# Patient Record
Sex: Female | Born: 1960 | Race: White | Hispanic: No | Marital: Single | Smoking: Never smoker
Health system: Southern US, Community
[De-identification: ages and names within clinical notes are randomized; demographics above are authoritative.]

## PROBLEM LIST (undated history)

## (undated) DIAGNOSIS — T7840XA Allergy, unspecified, initial encounter: Secondary | ICD-10-CM

## (undated) HISTORY — DX: Allergy, unspecified, initial encounter: T78.40XA

---

## 2013-05-28 ENCOUNTER — Emergency Department (INDEPENDENT_AMBULATORY_CARE_PROVIDER_SITE_OTHER): Payer: BC Managed Care – PPO

## 2013-05-28 ENCOUNTER — Encounter (HOSPITAL_COMMUNITY): Payer: Self-pay | Admitting: *Deleted

## 2013-05-28 ENCOUNTER — Emergency Department (HOSPITAL_COMMUNITY)
Admission: EM | Admit: 2013-05-28 | Discharge: 2013-05-28 | Disposition: A | Discharge status: Home / Self Care | Payer: BC Managed Care – PPO | Source: Home / Self Care | Attending: Emergency Medicine | Admitting: Emergency Medicine

## 2013-05-28 DIAGNOSIS — N200 Calculus of kidney: Secondary | ICD-10-CM

## 2013-05-28 LAB — POCT URINALYSIS DIP (DEVICE)
Glucose, UA: NEGATIVE mg/dL
Hgb urine dipstick: NEGATIVE
Leukocytes, UA: NEGATIVE
Nitrite: NEGATIVE
Protein, ur: NEGATIVE mg/dL
Specific Gravity, Urine: <=1.005 (ref 1.005–1.030)
Urobilinogen, UA: 0.2 mg/dL (ref 0.0–1.0)
pH: 5.5 (ref 5.0–8.0)

## 2013-05-28 MED ORDER — MELOXICAM 15 MG PO TABS: 15.0000 mg | ORAL_TABLET | Freq: Every day | ORAL | Status: DC

## 2013-05-28 MED ORDER — KETOROLAC TROMETHAMINE 60 MG/2ML IM SOLN
60.0000 mg | Freq: Once | INTRAMUSCULAR | Status: AC
  Administered 2013-05-28: 60 mg via INTRAMUSCULAR

## 2013-05-28 MED ORDER — KETOROLAC TROMETHAMINE 60 MG/2ML IM SOLN
INTRAMUSCULAR | Status: AC
  Filled 2013-05-28: qty 2

## 2013-05-28 MED ORDER — OXYCODONE-ACETAMINOPHEN 5-325 MG PO TABS: ORAL_TABLET | ORAL | Status: DC

## 2013-05-28 MED ORDER — TAMSULOSIN HCL 0.4 MG PO CAPS: 0.4000 mg | ORAL_CAPSULE | Freq: Every day | ORAL | Status: DC

## 2013-05-28 NOTE — ED Notes (Signed)
Pt  Reports a  History  Of  Kidney  Stones    She  Reports  Diffuse  low  Back  Pain  With  Nausea  That  Developed  Last  Pm    She  denys  Any  Recent  specefic  Injury

## 2013-05-28 NOTE — ED Provider Notes (Signed)
Chief Complaint:   Chief Complaint  Patient presents with  . Back Pain    History of Present Illness:   Sylvia White is a 52 year old female who was awakened at 4 AM this morning with bilateral, crampy lower back pain rated 6/10 in intensity. There was no radiation the pain. Nothing made it better or worse. She denies any numbness, tingling, weakness, or bladder or bowel dysfunction. She has a history of having passed about 3 kidney stones since 1983. Her last kidney stone was in 2004. She's had no dysuria, frequency, or blood in the urine. She denies fever, chills, abdominal pain, nausea, or vomiting.  Review of Systems:  Other than noted above, the patient denies any of the following symptoms: Systemic:  No fever, chills, severe fatigue, or unexplained weight loss. GI:  No abdominal pain, nausea, vomiting, diarrhea, constipation, incontinence of bowel, or blood in stool. GU:  No dysuria, frequency, urgency, or hematuria. No incontinence of urine or difficulty urinating.  M-S:  No neck pain, joint pain, arthritis, or myalgias. Neuro:  No paresthesias, saddle anesthesia, muscular weakness, or progressive neurological deficit.  PMFSH:  Past medical history, family history, social history, meds, and allergies were reviewed. Specifically, there is no history of cancer, major trauma, osteoporosis, immunosuppression, or HIV infection. She takes Synthroid for hypothyroidism.  Physical Exam:   Vital signs:  BP 112/78  Pulse 78  Temp(Src) 98.6 F (37 C) (Oral)  Resp 16  SpO2 100% General:  Alert, oriented, in no distress. Abdomen:  Soft, non-tender.  No organomegaly or mass.  No pulsatile midline abdominal mass or bruit. Back:  No CVA tenderness. Her back has a full range of motion with mild pain. Straight leg raising is negative. Neuro:  Normal muscle strength, sensations and DTRs. Extremities: Pedal pulses were full, there was no edema. Skin:  Clear, warm and dry.  No rash.  Labs:   Results for  orders placed during the hospital encounter of 05/28/13  POCT URINALYSIS DIP (DEVICE)      Result Value Range   Glucose, UA NEGATIVE  NEGATIVE mg/dL   Bilirubin Urine NEGATIVE  NEGATIVE   Ketones, ur NEGATIVE  NEGATIVE mg/dL   Specific Gravity, Urine <=1.005  1.005 - 1.030   Hgb urine dipstick NEGATIVE  NEGATIVE   pH 5.5  5.0 - 8.0   Protein, ur NEGATIVE  NEGATIVE mg/dL   Urobilinogen, UA 0.2  0.0 - 1.0 mg/dL   Nitrite NEGATIVE  NEGATIVE   Leukocytes, UA NEGATIVE  NEGATIVE     Radiology:  Dg Abd 1 View  05/28/2013   CLINICAL DATA:  52 year old female with pain on the right side. Back pain. History of right kidney stone. Initial encounter.  EXAM: ABDOMEN - 1 VIEW  COMPARISON:  None.  FINDINGS: Portable AP supine view at 0900 hrs. 6-7 mm calculus projecting at the right renal lower pole. Small calculi in the right hemipelvis most resemble phleboliths. No definite additional urologic calculus. Visualized bowel gas pattern is non obstructed. No acute osseous abnormality identified.  IMPRESSION: 6-7 mm lower pole region right nephrolithiasis. No other urologic calculus identified. Non obstructed bowel gas pattern.   Electronically Signed   By: Augusto Gamble M.D.   On: 05/28/2013 09:16    Course in Urgent Care Center:   Given Toradol 60 mg IM.  Assessment:  The encounter diagnosis was Kidney stone.  Plan:   1.  Meds:  The following meds were prescribed:   New Prescriptions   MELOXICAM (MOBIC) 15  MG TABLET    Take 1 tablet (15 mg total) by mouth daily.   OXYCODONE-ACETAMINOPHEN (PERCOCET) 5-325 MG PER TABLET    1 to 2 tablets every 6 hours as needed for pain.   TAMSULOSIN (FLOMAX) 0.4 MG CAPS CAPSULE    Take 1 capsule (0.4 mg total) by mouth daily.    2.  Patient Education/Counseling:  The patient was given appropriate handouts, self care instructions, and instructed in symptomatic relief. She was instructed to strain her urine and try to catch the kidney stone, increase liquids, and discussed  dietary treatment of kidney stone disease.  3.  Follow up:  The patient was told to follow up if no better in 3 to 4 days, if becoming worse in any way, and given some red flag symptoms such as fever, worsening pain, or persistent vomiting which would prompt immediate return.  Follow up with Dr. Marlou Porch if no better by next week.     Reuben Likes, MD 05/28/13 [id number]

## 2014-08-02 ENCOUNTER — Other Ambulatory Visit: Payer: Self-pay | Admitting: Family Medicine

## 2014-08-02 ENCOUNTER — Other Ambulatory Visit (HOSPITAL_COMMUNITY)
Admission: RE | Admit: 2014-08-02 | Discharge: 2014-08-02 | Disposition: A | Discharge status: Home / Self Care | Payer: BC Managed Care – PPO | Source: Ambulatory Visit | Attending: Family Medicine | Admitting: Family Medicine

## 2014-08-02 DIAGNOSIS — Z124 Encounter for screening for malignant neoplasm of cervix: Secondary | ICD-10-CM | POA: Insufficient documentation

## 2014-08-02 DIAGNOSIS — Z1151 Encounter for screening for human papillomavirus (HPV): Secondary | ICD-10-CM | POA: Diagnosis present

## 2014-08-04 LAB — CYTOLOGY - PAP

## 2015-09-02 ENCOUNTER — Encounter: Payer: Self-pay | Admitting: Endocrinology

## 2015-09-02 ENCOUNTER — Ambulatory Visit (INDEPENDENT_AMBULATORY_CARE_PROVIDER_SITE_OTHER): Payer: BC Managed Care – PPO | Admitting: Endocrinology

## 2015-09-02 VITALS — BP 104/62 | HR 57 | Temp 98.3°F | Resp 14 | Ht 64.0 in | Wt 133.8 lb

## 2015-09-02 DIAGNOSIS — E038 Other specified hypothyroidism: Secondary | ICD-10-CM

## 2015-09-02 DIAGNOSIS — E063 Autoimmune thyroiditis: Secondary | ICD-10-CM | POA: Insufficient documentation

## 2015-09-02 MED ORDER — THYROID 60 MG PO TABS: 60.0000 mg | ORAL_TABLET | Freq: Every day | ORAL | Status: DC

## 2015-09-02 NOTE — Progress Notes (Signed)
Patient ID: Sylvia BackboneLisa White, female   DOB: 05-23-1961, 55 y.o.   MRN: [id number]            Reason for Appointment:  Hypothyroidism, new visit    History of Present Illness:   Hypothyroidism was first diagnosed in 1990s  At the time of diagnosis patient was having symptoms of significant  fatigue but no other typical symptoms that she remembers.  She was tested for thyroid because of her sister also having the same diagnosis Initially was given 50 g of levothyroxine and subsequently 75 With starting thyroid supplementation the patient's symptoms had improved  She has been on 75 g levothyroxine for several years now.  About 6 years [facility] but lately by PCP She thinks that for the last 2-3 years she has been feeling more tired She wakes up feeling tired in the morning and has no energy left in the evenings after work. She also things that she has more irritability, difficulty with clear thinking, some hair loss and gradual weight gain She does feel cold sensitivity but this is not any worse.  Has cold hands. No dry skin or constipation She thinks she has difficulty losing weight despite eating a healthy diet and exercising regularly including running  She is taking brand name Synthroid for some time.  She is consistent with taking that about an hour before breakfast        Patient's weight history is as follows:  Wt Readings from Last 3 Encounters:  09/02/15 133 lb 12.8 oz (60.691 kg)    Thyroid function results have been as follows:  TSH in 11/16 was 1.46 and in 04/2014 was 1.68  No results found for: FREET4, TSH   Past Medical History  Diagnosis Date  . Allergy     No past surgical history on file.  Family History  Problem Relation Age of Onset  . Hypothyroidism Mother   . Hypothyroidism Father   . Diabetes Maternal Grandmother     Social History:  reports that she has never smoked. She has never used smokeless tobacco. Her  alcohol and drug histories are not on file.  Allergies: No Known Allergies    Medication List       This list is accurate as of: 09/02/15  4:24 PM.  Always use your most recent med list.               ALLEGRA ALLERGY 180 MG tablet  Generic drug:  fexofenadine     FLULAVAL QUADRIVALENT injection  Generic drug:  influenza vac split quadrivalent  ADM 0.5ML IM UTD     NASACORT ALLERGY 24HR 55 MCG/ACT Aero nasal inhaler  Generic drug:  triamcinolone     thyroid 60 MG tablet  Commonly known as:  ARMOUR THYROID  Take 1 tablet (60 mg total) by mouth daily before breakfast.     Vitamin D 400 UNIT/ML Liqd        Review of Systems:  Review of Systems  Constitutional: Positive for weight gain.  Respiratory: Negative for shortness of breath.   Cardiovascular: Negative for palpitations.  Endocrine: Positive for fatigue and cold intolerance.       She had premature menopause at age 55.  Periodically does feel hot  Genitourinary: Negative for nocturia.  Musculoskeletal: Negative for joint pain.  Neurological:       Foggy  Psychiatric/Behavioral: Positive for insomnia.       She wakes up because she feels heart  but goes back to sleep. Not anxious but has increased irritability Does not think she has depressed mood but overall feels blah                 Examination:    BP 104/62 mmHg  Pulse 57  Temp(Src) 98.3 F (36.8 C)  Resp 14  Ht 5\' 4"  (1.626 m)  Wt 133 lb 12.8 oz (60.691 kg)  BMI 22.96 kg/m2  SpO2 97%  GENERAL:  Average build.   No pallor, clubbing, lymphadenopathy or edema.  Skin:  no rash or pigmentation.  EYES:  No prominence of the eyes or swelling of the eyelids  ENT: Oral mucosa and tongue normal.  THYROID:  Not palpable.  HEART:  Normal  S1 and S2; no murmur or click.  CHEST:    Lungs: Vescicular breath sounds heard equally.  No crepitations/ wheeze.  ABDOMEN: Exam not indicated  NEUROLOGICAL: Reflexes are difficult to elicit bilaterally at  biceps and ankles.  JOINTS:  Normal.   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   She probably also has inadequate sleep quality with waking up tired  Since she does not believe that she has depression discussed that some patients may subjectively feels better with the combination of levothyroxine and T3  PLAN:  Recommended that she can do an empirical trial of Armour Thyroid 60 mg which would be equivalent to 75 g of Synthroid This would be simpler to prescribe and adjust than Synthroid and Cytomel separately and can be given once a day If she is subjectively improved in 6 weeks she can continue the new regimen, will need to have follow-up thyroid panel done also to assess her response to this Also discussed possibility of using a component preparation if she is having good response but has abnormal free T4 or free T3 levels in follow-up   [facility] 09/02/2015, 4:24 PM     Note: This office note was prepared with Dragon voice recognition system technology. Any transcriptional errors that result from this process are unintentional.

## 2015-10-04 ENCOUNTER — Other Ambulatory Visit: Payer: BC Managed Care – PPO

## 2015-10-07 ENCOUNTER — Ambulatory Visit: Payer: BC Managed Care – PPO | Admitting: Endocrinology

## 2020-08-03 ENCOUNTER — Ambulatory Visit: Payer: Self-pay

## 2020-08-03 ENCOUNTER — Ambulatory Visit: Payer: BC Managed Care – PPO | Admitting: Family Medicine

## 2020-08-03 ENCOUNTER — Encounter: Payer: Self-pay | Admitting: Family Medicine

## 2020-08-03 ENCOUNTER — Other Ambulatory Visit: Payer: Self-pay

## 2020-08-03 VITALS — BP 118/78 | Ht 64.0 in | Wt 130.0 lb

## 2020-08-03 DIAGNOSIS — M79662 Pain in left lower leg: Secondary | ICD-10-CM | POA: Diagnosis not present

## 2020-08-03 DIAGNOSIS — M7711 Lateral epicondylitis, right elbow: Secondary | ICD-10-CM

## 2020-08-03 NOTE — Patient Instructions (Signed)
You have a distal tibia stress fracture. Wear the aircast brace when up and walking around and follow the protocol on a weekly basis. Icing 15 minutes at a time 3-4 times a day. Follow up with me in 2 weeks for reevaluation, repeat ultrasound.  You have lateral epicondylitis Try to avoid painful activities as much as possible. Ice the area 3-4 times a day for 15 minutes at a time. Voltaren gel topically as needed. Counterforce brace (or KT tape) as directed can help unload area - wear this regularly if it provides you with relief. Hammer rotation exercise, wrist extension exercise with 1 pound weight - 3 sets of 10 once a day.   Stretching - hold for 20-30 seconds and repeat 3 times. Consider physical therapy, injection, nitro patches if not improving. Follow up in 6 weeks for this issue.

## 2020-08-03 NOTE — Progress Notes (Signed)
PCP: Clayborn Heron, MD  Subjective:   HPI: Patient is a 59 y.o. female here for left shin pain and right elbow pain.  Regarding the left shin pain the patient reports she is a runner and runs on average 15 miles a week.  She was running on Thanksgiving day and noticed pain at her distal third of her shin with every step.  The pain progressively worsened and eventually got to the point where it was hurting for her to walk.  She has not experienced pain like this before and denies any trauma to the shin.  She has had shin splints in the past but says that it feels different.  Has not run since 12/4.  Has used topical medications with little relief.  Her right elbow pain occurs intermittently.  She reports it started when she got a rowing machine in September.  Notices point tenderness on the lateral aspect of the elbow which is aggravated by lifting objects.  She is not complaining of pain at this time.  Is concerned because she does not want to make things worse if she continues to use her rowing machine.  Past Medical History:  Diagnosis Date  . Allergy     Current Outpatient Medications on File Prior to Visit  Medication Sig Dispense Refill  . Cholecalciferol (VITAMIN D) 400 UNIT/ML LIQD     . fexofenadine (ALLEGRA ALLERGY) 180 MG tablet     . FLULAVAL QUADRIVALENT injection ADM 0.5ML IM UTD  0  . thyroid (ARMOUR THYROID) 60 MG tablet Take 1 tablet (60 mg total) by mouth daily before breakfast. 30 tablet 3  . triamcinolone (NASACORT ALLERGY 24HR) 55 MCG/ACT AERO nasal inhaler      No current facility-administered medications on file prior to visit.    No past surgical history on file.  No Known Allergies  Social History   Socioeconomic History  . Marital status: Single    Spouse name: Not on file  . Number of children: Not on file  . Years of education: Not on file  . Highest education level: Not on file  Occupational History  . Not on file  Tobacco Use  . Smoking  status: Never Smoker  . Smokeless tobacco: Never Used  Substance and Sexual Activity  . Alcohol use: Not on file  . Drug use: Not on file  . Sexual activity: Not on file  Other Topics Concern  . Not on file  Social History Narrative  . Not on file   Social Determinants of Health   Financial Resource Strain:   . Difficulty of Paying Living Expenses: Not on file  Food Insecurity:   . Worried About Programme researcher, broadcasting/film/video in the Last Year: Not on file  . Ran Out of Food in the Last Year: Not on file  Transportation Needs:   . Lack of Transportation (Medical): Not on file  . Lack of Transportation (Non-Medical): Not on file  Physical Activity:   . Days of Exercise per Week: Not on file  . Minutes of Exercise per Session: Not on file  Stress:   . Feeling of Stress : Not on file  Social Connections:   . Frequency of Communication with Friends and Family: Not on file  . Frequency of Social Gatherings with Friends and Family: Not on file  . Attends Religious Services: Not on file  . Active Member of Clubs or Organizations: Not on file  . Attends Banker Meetings: Not on file  .  Marital Status: Not on file  Intimate Partner Violence:   . Fear of Current or Ex-Partner: Not on file  . Emotionally Abused: Not on file  . Physically Abused: Not on file  . Sexually Abused: Not on file    Family History  Problem Relation Age of Onset  . Hypothyroidism Mother   . Hypothyroidism Father   . Diabetes Maternal Grandmother     BP 118/78   Ht 5\' 4"  (1.626 m)   Wt 130 lb (59 kg)   BMI 22.31 kg/m   [facility] Exercise 08/03/2020  Frequency of aerobic exercise (# of days/week) 5  Average time in minutes 45  Frequency of strengthening activities (# of days/week) 2    No flowsheet data found.  Review of Systems: See HPI above.     Objective:  Physical Exam:  Gen: NAD, comfortable in exam room  Left lower extremity exam: No obvious deformities noted,  no erythema, edema noted full range of motion in knee and ankle without pain.  Tenderness to palpation along medial aspect of distal third of her left tibia.  No swelling noted.    Right Elbow: Inspection yields no evidence of bony deformity, effusion, erythema, ecchymosis, or rash. Active and passive ROM intact in flexion/extension/supination/pronation. Strength 5/5 throughout.  Mild tenderness to lateral epicondyle.  No pain with finger/wrist extension against resistance. No pain with gripping or finger/wrist flexion against resistance. No evidence of pain or laxity at the UCL.   Limited ultrasound left lower extremity:  Small cortical irregularity over distal tibia, edema overlying cortex, and neovascularity noted consistent with stress fracture.   Assessment & Plan:  1.  Stress fracture of left tibia Patient with history and physical exam consistent with stress fracture.  Ultrasound consistent with stress fracture distal third of left tibia.  Initiated stress fracture protocol with the patient.  Provided information regarding the protocol.  Long aircast.  Icing if needed.  Follow-up in 2 weeks for reevaluation and repeat ultrasound.  2.  Lateral epicondylitis Patient with pain of the lateral epicondyle since using rowing machine.  Physical exam consistent with lateral epicondylitis.  Ultrasound was reassuring.  Recommended rehab exercises for lateral epicondylitis.  Follow-up as needed.

## 2020-08-15 ENCOUNTER — Ambulatory Visit: Payer: Self-pay

## 2020-08-15 ENCOUNTER — Other Ambulatory Visit: Payer: Self-pay

## 2020-08-15 ENCOUNTER — Ambulatory Visit: Payer: BC Managed Care – PPO | Admitting: Family Medicine

## 2020-08-15 VITALS — BP 106/72 | Ht 64.0 in | Wt 130.0 lb

## 2020-08-15 DIAGNOSIS — M79662 Pain in left lower leg: Secondary | ICD-10-CM

## 2020-08-15 NOTE — Patient Instructions (Signed)
Wait 1 more week before starting phase 2 of the protocol. Wait a week to start rowing also. Icing 15 minutes at a time as needed. Follow up with me in about 4 weeks but call or message me sooner if you have questions or concerns.

## 2020-08-16 ENCOUNTER — Encounter: Payer: Self-pay | Admitting: Family Medicine

## 2020-08-16 NOTE — Progress Notes (Signed)
PCP: Clayborn Heron, MD  Subjective:   HPI: Patient is a 59 y.o. female here for left lower leg pain.  12/8: Regarding the left shin pain the patient reports she is a runner and runs on average 15 miles a week.  She was running on Thanksgiving day and noticed pain at her distal third of her shin with every step.  The pain progressively worsened and eventually got to the point where it was hurting for her to walk.  She has not experienced pain like this before and denies any trauma to the shin.  She has had shin splints in the past but says that it feels different.  Has not run since 12/4.  Has used topical medications with little relief.  Her right elbow pain occurs intermittently.  She reports it started when she got a rowing machine in September.  Notices point tenderness on the lateral aspect of the elbow which is aggravated by lifting objects.  She is not complaining of pain at this time.  Is concerned because she does not want to make things worse if she continues to use her rowing machine.  12/20: Patient reports she had been doing well until she rowed for 10 minutes. Felt great while doing this but developed an achy, throbbing pain anterior left shin. No swelling, bruising after this. Has been wearing aircast and following protocol. No acute injuries.  Past Medical History:  Diagnosis Date  . Allergy     Current Outpatient Medications on File Prior to Visit  Medication Sig Dispense Refill  . Cholecalciferol (VITAMIN D) 400 UNIT/ML LIQD     . fexofenadine (ALLEGRA ALLERGY) 180 MG tablet     . FLULAVAL QUADRIVALENT injection ADM 0.5ML IM UTD  0  . liothyronine (CYTOMEL) 5 MCG tablet Take 5 mcg by mouth daily.    Marland Kitchen SYNTHROID 75 MCG tablet Take 75 mcg by mouth daily.    Marland Kitchen triamcinolone (NASACORT ALLERGY 24HR) 55 MCG/ACT AERO nasal inhaler      No current facility-administered medications on file prior to visit.    History reviewed. No pertinent surgical history.  No  Known Allergies  Social History   Socioeconomic History  . Marital status: Single    Spouse name: Not on file  . Number of children: Not on file  . Years of education: Not on file  . Highest education level: Not on file  Occupational History  . Not on file  Tobacco Use  . Smoking status: Never Smoker  . Smokeless tobacco: Never Used  Substance and Sexual Activity  . Alcohol use: Not on file  . Drug use: Not on file  . Sexual activity: Not on file  Other Topics Concern  . Not on file  Social History Narrative  . Not on file   Social Determinants of Health   Financial Resource Strain: Not on file  Food Insecurity: Not on file  Transportation Needs: Not on file  Physical Activity: Not on file  Stress: Not on file  Social Connections: Not on file  Intimate Partner Violence: Not on file    Family History  Problem Relation Age of Onset  . Hypothyroidism Mother   . Hypothyroidism Father   . Diabetes Maternal Grandmother     BP 106/72   Ht 5\' 4"  (1.626 m)   Wt 130 lb (59 kg)   BMI 22.31 kg/m   [facility] Adult Exercise 08/03/2020  Frequency of aerobic exercise (# of days/week) 5  Average time in  minutes 45  Frequency of strengthening activities (# of days/week) 2    No flowsheet data found.  Review of Systems: See HPI above.     Objective:  Physical Exam:  Gen: NAD, comfortable in exam room  Left leg: No deformity, swelling, bruising. FROM with 5/5 strength ankle without reproduction of pain. Tenderness to palpation distal anterior tibia. NVI distally.  Limited MSK u/s left lower leg:  Small cortical irregularity visualized with neovascularity and edema overlying cortex.   Assessment & Plan:  1. Left tibia stress fracture - low risk site.  Flare of pain after rowing.  Will wait an additional week before starting phase 2 of protocol and rowing.  Icing as needed.  F/u in 4 weeks.

## 2020-09-19 ENCOUNTER — Ambulatory Visit: Payer: BC Managed Care – PPO | Admitting: Family Medicine

## 2020-09-19 ENCOUNTER — Ambulatory Visit: Payer: Self-pay

## 2020-09-19 ENCOUNTER — Encounter: Payer: Self-pay | Admitting: Family Medicine

## 2020-09-19 ENCOUNTER — Other Ambulatory Visit: Payer: Self-pay

## 2020-09-19 VITALS — BP 120/68 | Ht 64.0 in | Wt 130.0 lb

## 2020-09-19 DIAGNOSIS — M7989 Other specified soft tissue disorders: Secondary | ICD-10-CM | POA: Diagnosis not present

## 2020-09-19 DIAGNOSIS — M79662 Pain in left lower leg: Secondary | ICD-10-CM

## 2020-09-19 NOTE — Progress Notes (Signed)
PCP: Clayborn Heron, MD  Subjective:   HPI: Patient is a 60 y.o. female here for left lower leg pain.  12/8: Regarding the left shin pain the patient reports she is a runner and runs on average 15 miles a week.  She was running on Thanksgiving day and noticed pain at her distal third of her shin with every step.  The pain progressively worsened and eventually got to the point where it was hurting for her to walk.  She has not experienced pain like this before and denies any trauma to the shin.  She has had shin splints in the past but says that it feels different.  Has not run since 12/4.  Has used topical medications with little relief.  Her right elbow pain occurs intermittently.  She reports it started when she got a rowing machine in September.  Notices point tenderness on the lateral aspect of the elbow which is aggravated by lifting objects.  She is not complaining of pain at this time.  Is concerned because she does not want to make things worse if she continues to use her rowing machine.  12/20: Patient reports she had been doing well until she rowed for 10 minutes. Felt great while doing this but developed an achy, throbbing pain anterior left shin. No swelling, bruising after this. Has been wearing aircast and following protocol. No acute injuries.  09/19/20: Patient reports she's doing well. Has not really tested leg since last visit. Has been rowing and walking without any issues. She tried running over a month ago and had some aching afterwards so decided not to run until reevaluation.  Past Medical History:  Diagnosis Date  . Allergy     Current Outpatient Medications on File Prior to Visit  Medication Sig Dispense Refill  . Cholecalciferol (VITAMIN D) 400 UNIT/ML LIQD     . fexofenadine (ALLEGRA ALLERGY) 180 MG tablet     . FLULAVAL QUADRIVALENT injection ADM 0.5ML IM UTD  0  . liothyronine (CYTOMEL) 5 MCG tablet Take 5 mcg by mouth daily.    Marland Kitchen SYNTHROID 75 MCG  tablet Take 75 mcg by mouth daily.    Marland Kitchen triamcinolone (NASACORT ALLERGY 24HR) 55 MCG/ACT AERO nasal inhaler      No current facility-administered medications on file prior to visit.    History reviewed. No pertinent surgical history.  No Known Allergies  Social History   Socioeconomic History  . Marital status: Single    Spouse name: Not on file  . Number of children: Not on file  . Years of education: Not on file  . Highest education level: Not on file  Occupational History  . Not on file  Tobacco Use  . Smoking status: Never Smoker  . Smokeless tobacco: Never Used  Substance and Sexual Activity  . Alcohol use: Not on file  . Drug use: Not on file  . Sexual activity: Not on file  Other Topics Concern  . Not on file  Social History Narrative  . Not on file   Social Determinants of Health   Financial Resource Strain: Not on file  Food Insecurity: Not on file  Transportation Needs: Not on file  Physical Activity: Not on file  Stress: Not on file  Social Connections: Not on file  Intimate Partner Violence: Not on file    Family History  Problem Relation Age of Onset  . Hypothyroidism Mother   . Hypothyroidism Father   . Diabetes Maternal Grandmother  BP 120/68   Ht 5\' 4"  (1.626 m)   Wt 130 lb (59 kg)   BMI 22.31 kg/m   [facility] Exercise 08/03/2020  Frequency of aerobic exercise (# of days/week) 5  Average time in minutes 45  Frequency of strengthening activities (# of days/week) 2    No flowsheet data found.  Review of Systems: See HPI above.     Objective:  Physical Exam:  Gen: NAD, comfortable in exam room  Left leg: No deformity. FROM with 5/5 strength. No tenderness to palpation. NVI distally. Negative hop test.  Limited MSK u/s left lower leg:  Small callus without neovascularity in area of stress fracture.   Assessment & Plan:  1. Left tibia stress fracture - excellent healing since last visit clinically and by  ultrasound.  Restart protocol.  Icing if needed.  F/u in 6 weeks.

## 2020-09-19 NOTE — Patient Instructions (Signed)
You're doing great! Restart the protocol at the 400/482m jog level with the aircast and advance from there. I don't expect you to have any issues but call me if you do. Follow up with me in 6 weeks.

## 2020-10-19 ENCOUNTER — Ambulatory Visit
Admission: RE | Admit: 2020-10-19 | Discharge: 2020-10-19 | Disposition: A | Discharge status: Home / Self Care | Payer: BC Managed Care – PPO | Source: Ambulatory Visit | Attending: Family Medicine | Admitting: Family Medicine

## 2020-10-19 ENCOUNTER — Ambulatory Visit: Payer: Self-pay

## 2020-10-19 ENCOUNTER — Ambulatory Visit: Payer: BC Managed Care – PPO | Admitting: Family Medicine

## 2020-10-19 ENCOUNTER — Other Ambulatory Visit: Payer: Self-pay

## 2020-10-19 VITALS — BP 102/70 | Ht 64.0 in | Wt 129.0 lb

## 2020-10-19 DIAGNOSIS — M79662 Pain in left lower leg: Secondary | ICD-10-CM

## 2020-10-19 NOTE — Patient Instructions (Signed)
Stop running for now. Ok for walking, rowing, cycling. I'll call you with the results of the MRI and next steps.

## 2020-10-20 ENCOUNTER — Encounter: Payer: Self-pay | Admitting: Family Medicine

## 2020-10-20 NOTE — Progress Notes (Signed)
PCP: Clayborn Heron, MD  Subjective:   HPI: Patient is a 60 y.o. female here for left lower leg pain.  12/8: Regarding the left shin pain the patient reports she is a runner and runs on average 15 miles a week.  She was running on Thanksgiving day and noticed pain at her distal third of her shin with every step.  The pain progressively worsened and eventually got to the point where it was hurting for her to walk.  She has not experienced pain like this before and denies any trauma to the shin.  She has had shin splints in the past but says that it feels different.  Has not run since 12/4.  Has used topical medications with little relief.  Her right elbow pain occurs intermittently.  She reports it started when she got a rowing machine in September.  Notices point tenderness on the lateral aspect of the elbow which is aggravated by lifting objects.  She is not complaining of pain at this time.  Is concerned because she does not want to make things worse if she continues to use her rowing machine.  12/20: Patient reports she had been doing well until she rowed for 10 minutes. Felt great while doing this but developed an achy, throbbing pain anterior left shin. No swelling, bruising after this. Has been wearing aircast and following protocol. No acute injuries.  09/19/20: Patient reports she's doing well. Has not really tested leg since last visit. Has been rowing and walking without any issues. She tried running over a month ago and had some aching afterwards so decided not to run until reevaluation.  2/23: Patient reports she had been doing well up until last week when running 2 mile repeats every other day. For most part had pain in area of fracture site only after running. But when tried a 2.5 mile run did feel some pain here during the run. Has been exercising with the long aircast in place. No new injuries.  Past Medical History:  Diagnosis Date  . Allergy     Current  Outpatient Medications on File Prior to Visit  Medication Sig Dispense Refill  . Cholecalciferol (VITAMIN D) 400 UNIT/ML LIQD     . fexofenadine (ALLEGRA ALLERGY) 180 MG tablet     . FLULAVAL QUADRIVALENT injection ADM 0.5ML IM UTD  0  . liothyronine (CYTOMEL) 5 MCG tablet Take 5 mcg by mouth daily.    Marland Kitchen SYNTHROID 75 MCG tablet Take 75 mcg by mouth daily.    Marland Kitchen triamcinolone (NASACORT ALLERGY 24HR) 55 MCG/ACT AERO nasal inhaler      No current facility-administered medications on file prior to visit.    History reviewed. No pertinent surgical history.  No Known Allergies  Social History   Socioeconomic History  . Marital status: Single    Spouse name: Not on file  . Number of children: Not on file  . Years of education: Not on file  . Highest education level: Not on file  Occupational History  . Not on file  Tobacco Use  . Smoking status: Never Smoker  . Smokeless tobacco: Never Used  Substance and Sexual Activity  . Alcohol use: Not on file  . Drug use: Not on file  . Sexual activity: Not on file  Other Topics Concern  . Not on file  Social History Narrative  . Not on file   Social Determinants of Health   Financial Resource Strain: Not on file  Food Insecurity: Not on  file  Transportation Needs: Not on file  Physical Activity: Not on file  Stress: Not on file  Social Connections: Not on file  Intimate Partner Violence: Not on file    Family History  Problem Relation Age of Onset  . Hypothyroidism Mother   . Hypothyroidism Father   . Diabetes Maternal Grandmother     BP 102/70   Ht 5\' 4"  (1.626 m)   Wt 129 lb (58.5 kg)   BMI 22.14 kg/m   [facility] 08/03/2020  Frequency of aerobic exercise (# of days/week) 5  Average time in minutes 45  Frequency of strengthening activities (# of days/week) 2    No flowsheet data found.  Review of Systems: See HPI above.     Objective:  Physical Exam:  Gen: NAD, comfortable in exam  room  Left leg: No deformity. FROM with 5/5 strength. Mild tenderness to palpation distal medial tibia. NVI distally. Minimal pain with hop test.  Limited MSK u/s left lower leg:  Small callus with mild neovascularity (increased from prior) in area of stress fracture.   Assessment & Plan:  1. Left tibia stress fracture - unusual that despite protocol, long aircast over 8 weeks out that she's had recurrence of pain in area of stress fracture.  Concern for possible second stress fracture vs delayed union.  Will go ahead with MRI to assess.  Stop running.  Cross training ok if not painful.  Icing, tylenol if needed.  Will call her with results.

## 2020-10-31 ENCOUNTER — Ambulatory Visit: Payer: BC Managed Care – PPO | Admitting: Family Medicine

## 2020-11-02 ENCOUNTER — Ambulatory Visit: Payer: BC Managed Care – PPO | Admitting: Family Medicine

## 2020-11-05 ENCOUNTER — Other Ambulatory Visit: Payer: Self-pay

## 2020-11-05 ENCOUNTER — Ambulatory Visit
Admission: RE | Admit: 2020-11-05 | Discharge: 2020-11-05 | Disposition: A | Discharge status: Home / Self Care | Payer: BC Managed Care – PPO | Source: Ambulatory Visit | Attending: Family Medicine | Admitting: Family Medicine

## 2020-11-05 DIAGNOSIS — M79662 Pain in left lower leg: Secondary | ICD-10-CM

## 2021-04-26 ENCOUNTER — Other Ambulatory Visit: Payer: Self-pay | Admitting: Family Medicine

## 2021-04-26 DIAGNOSIS — Z1231 Encounter for screening mammogram for malignant neoplasm of breast: Secondary | ICD-10-CM

## 2021-05-15 ENCOUNTER — Ambulatory Visit
Admission: RE | Admit: 2021-05-15 | Discharge: 2021-05-15 | Disposition: A | Discharge status: Home / Self Care | Payer: BC Managed Care – PPO | Source: Ambulatory Visit | Attending: Family Medicine | Admitting: Family Medicine

## 2021-05-15 ENCOUNTER — Other Ambulatory Visit: Payer: Self-pay

## 2021-05-15 DIAGNOSIS — Z1231 Encounter for screening mammogram for malignant neoplasm of breast: Secondary | ICD-10-CM

## 2021-05-22 ENCOUNTER — Other Ambulatory Visit: Payer: Self-pay | Admitting: Family Medicine

## 2021-05-22 DIAGNOSIS — R928 Other abnormal and inconclusive findings on diagnostic imaging of breast: Secondary | ICD-10-CM

## 2021-05-31 LAB — COLOGUARD: COLOGUARD: NEGATIVE

## 2021-06-30 ENCOUNTER — Other Ambulatory Visit: Payer: Self-pay

## 2021-06-30 ENCOUNTER — Ambulatory Visit
Admission: RE | Admit: 2021-06-30 | Discharge: 2021-06-30 | Disposition: A | Discharge status: Home / Self Care | Payer: BC Managed Care – PPO | Source: Ambulatory Visit | Attending: Family Medicine | Admitting: Family Medicine

## 2021-06-30 ENCOUNTER — Other Ambulatory Visit: Payer: Self-pay | Admitting: Family Medicine

## 2021-06-30 DIAGNOSIS — R928 Other abnormal and inconclusive findings on diagnostic imaging of breast: Secondary | ICD-10-CM

## 2021-06-30 DIAGNOSIS — N6320 Unspecified lump in the left breast, unspecified quadrant: Secondary | ICD-10-CM

## 2021-07-14 ENCOUNTER — Ambulatory Visit
Admission: RE | Admit: 2021-07-14 | Discharge: 2021-07-14 | Disposition: A | Discharge status: Home / Self Care | Payer: BC Managed Care – PPO | Source: Ambulatory Visit | Attending: Family Medicine | Admitting: Family Medicine

## 2021-07-14 ENCOUNTER — Other Ambulatory Visit: Payer: Self-pay | Admitting: Family Medicine

## 2021-07-14 ENCOUNTER — Other Ambulatory Visit: Payer: Self-pay

## 2021-07-14 DIAGNOSIS — N6320 Unspecified lump in the left breast, unspecified quadrant: Secondary | ICD-10-CM

## 2021-07-14 HISTORY — PX: BREAST BIOPSY: SHX20

## 2021-12-04 IMAGING — MG MM BREAST LOCALIZATION CLIP
4 series · 4 of 12 positions shown · non-contrast
Comparison: Previous exam(s).

CLINICAL DATA: Status post left breast ultrasound-guided biopsy.

EXAM:
3D DIAGNOSTIC LEFT MAMMOGRAM POST ULTRASOUND BIOPSY

[L ML synth-2D]
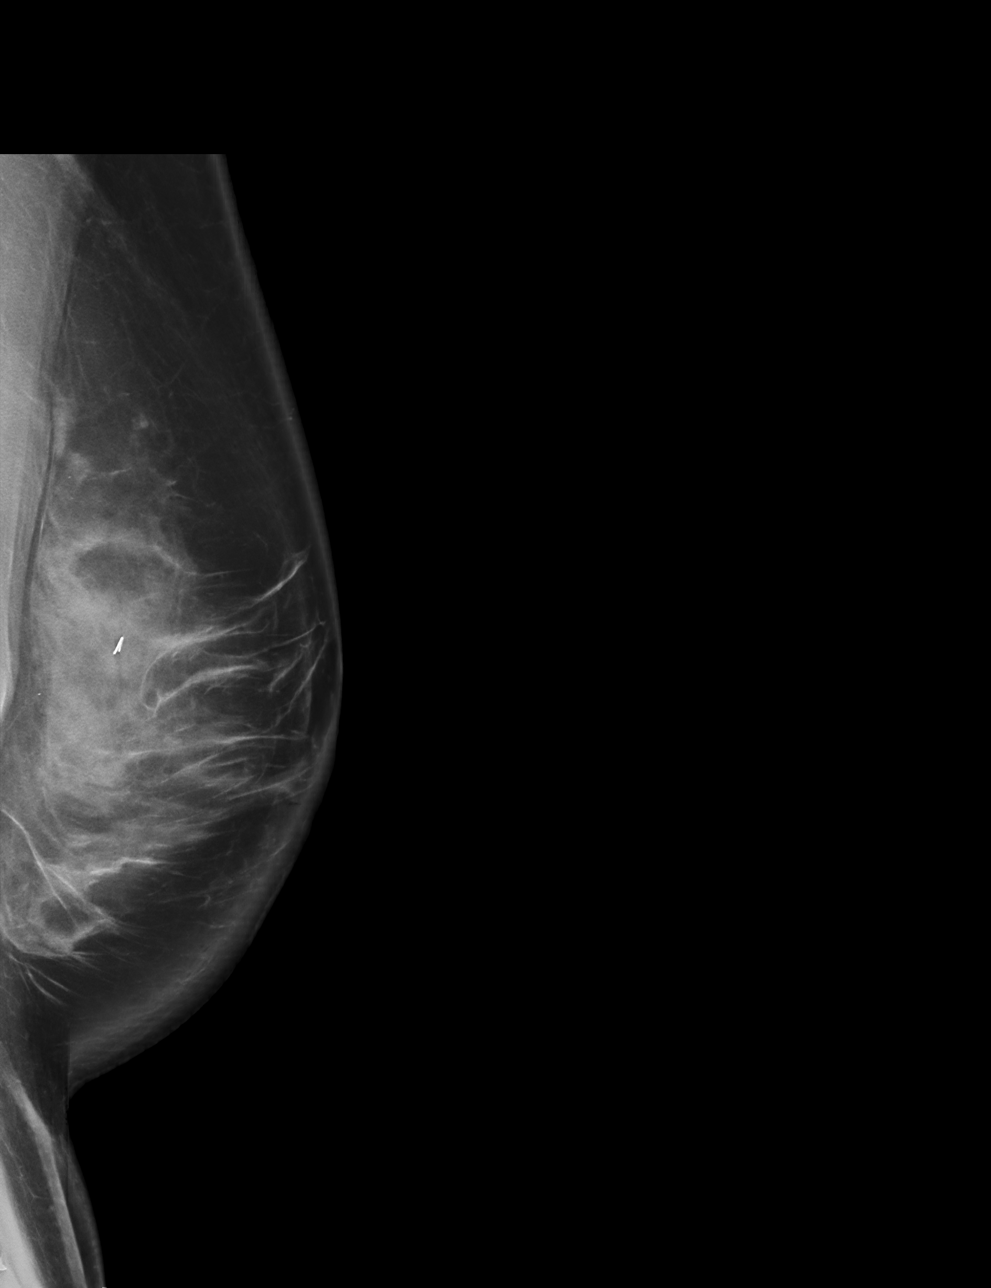

[L CC synth-2D]
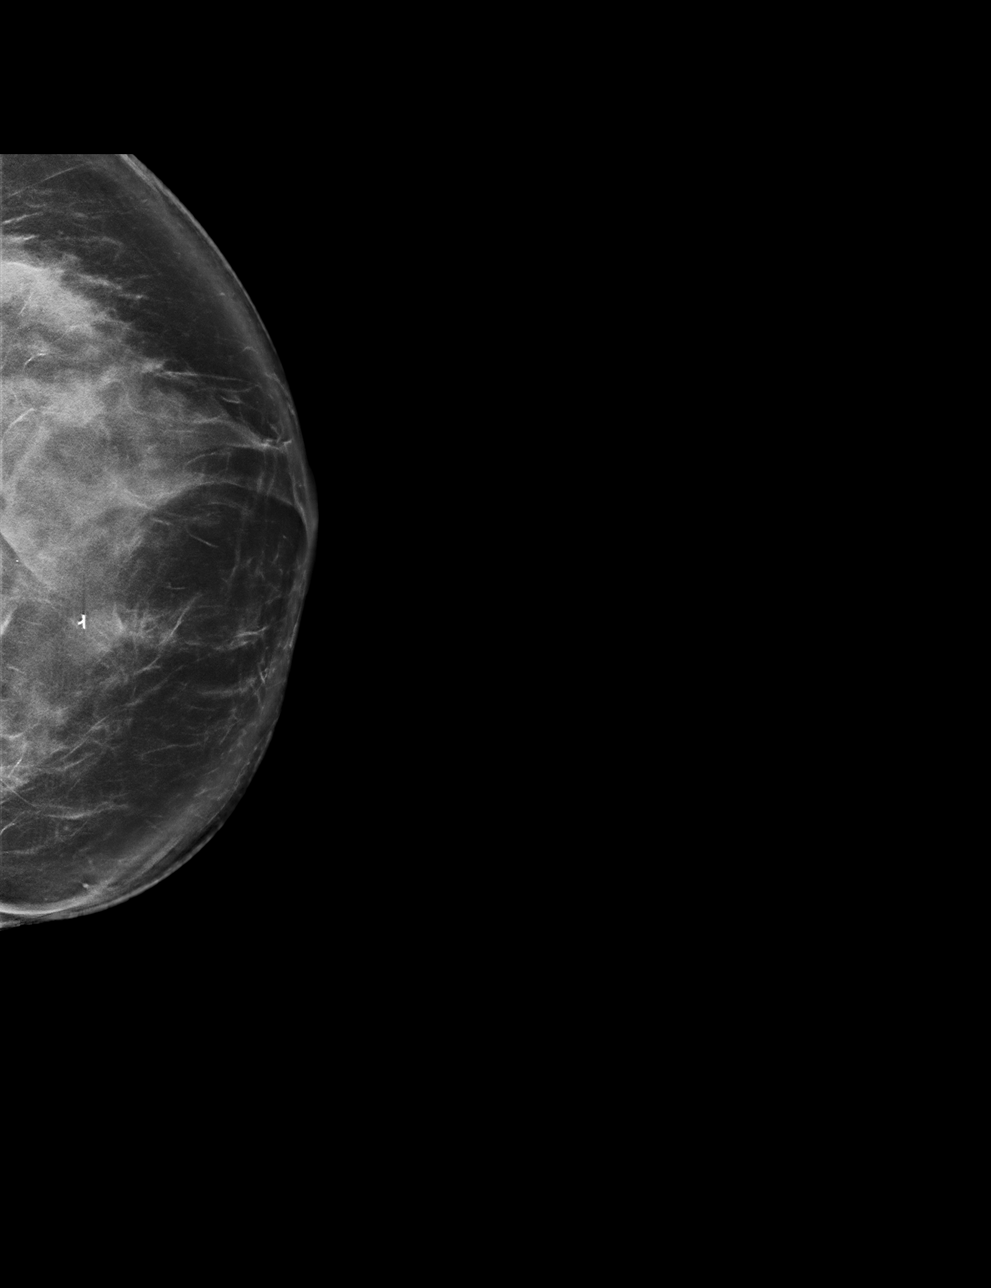

[L ML tomo · tomo slice 47/93.0]
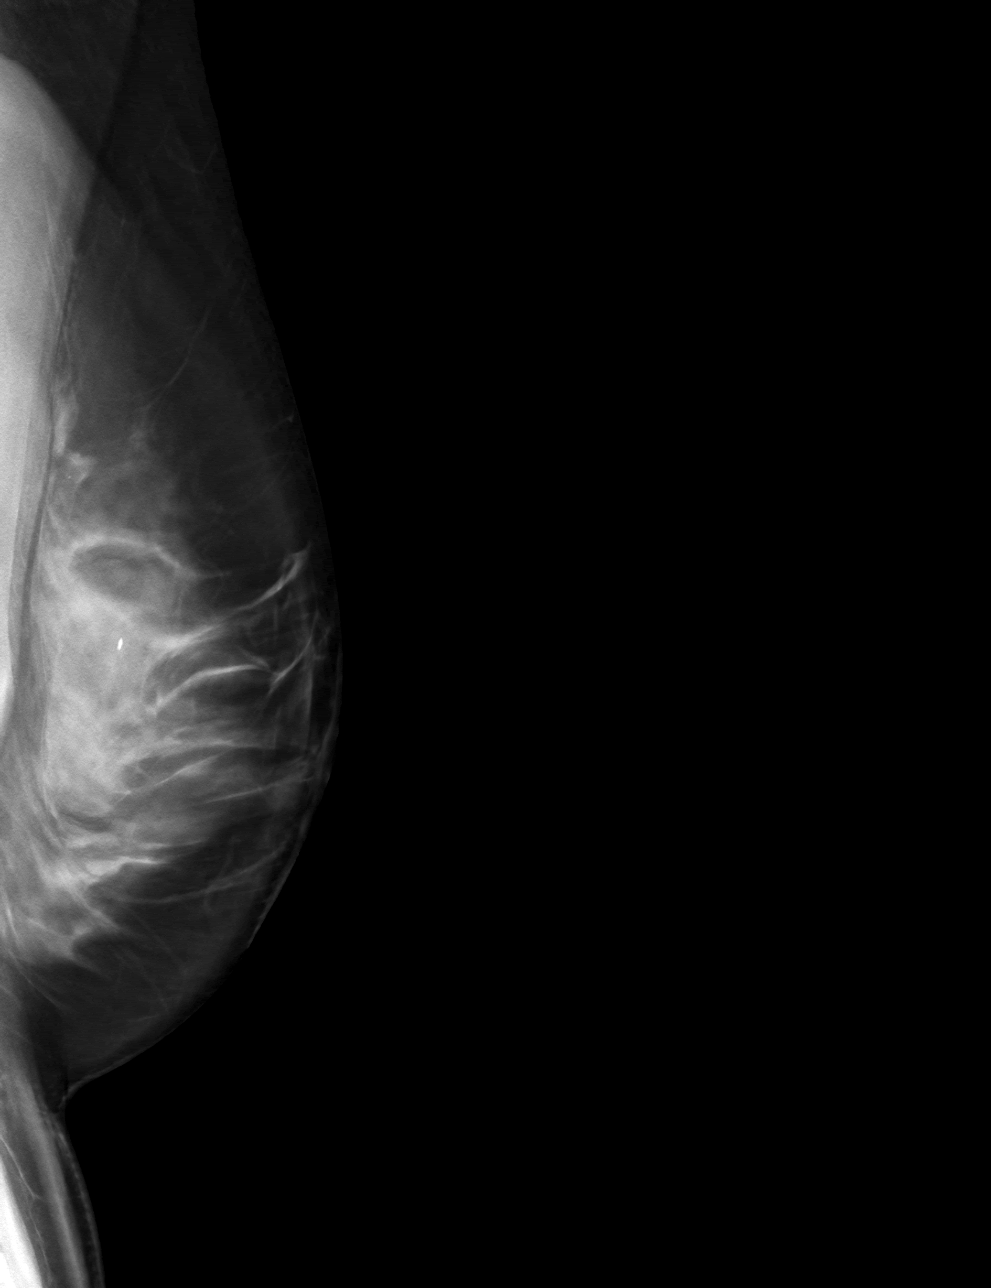

[L CC tomo · tomo slice 43/84.0]
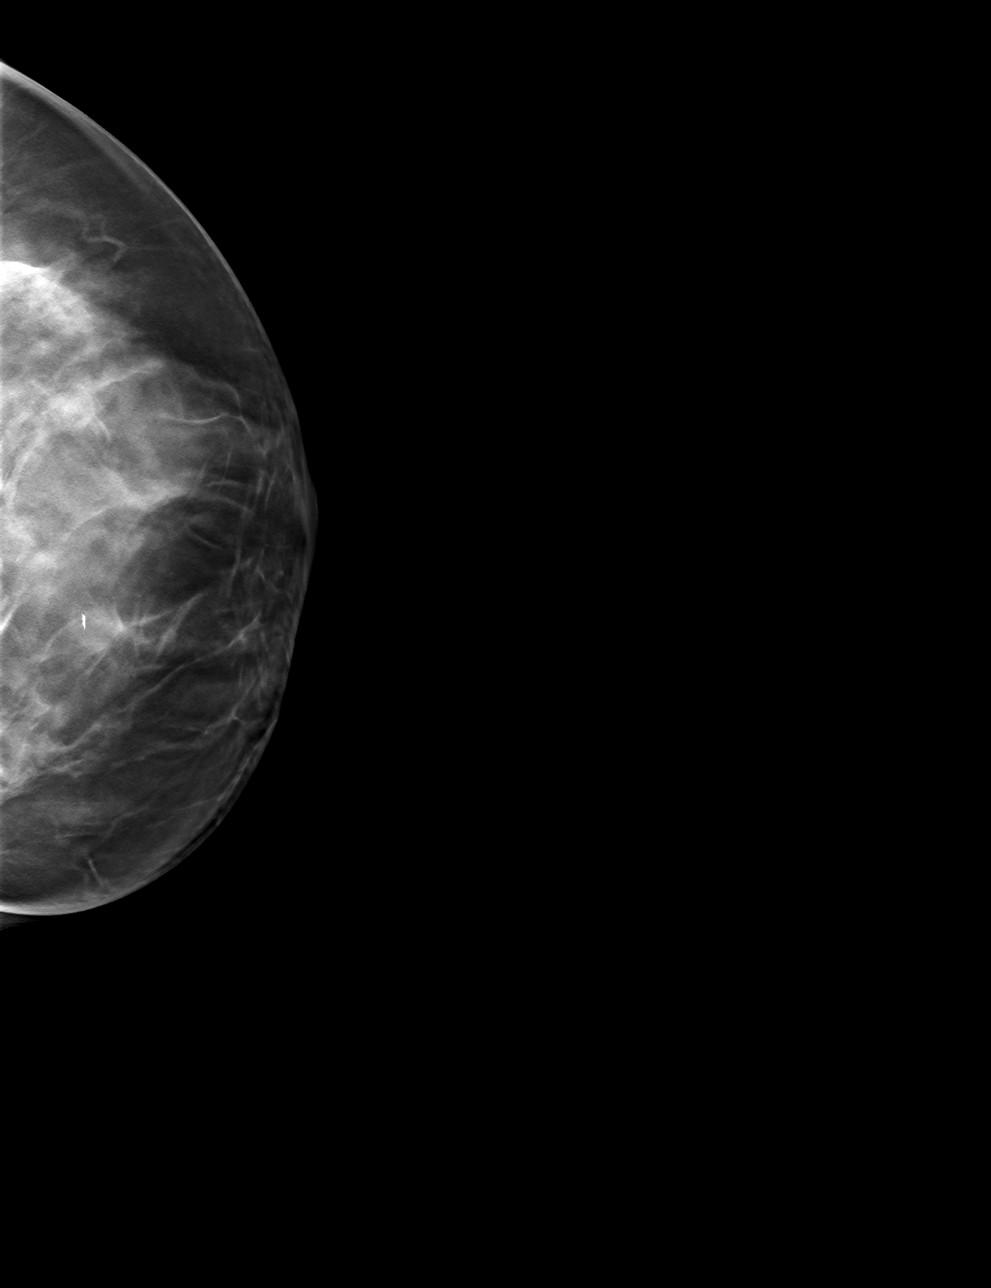

[4 of 12 positions shown; findings below may reference images not displayed]

FINDINGS: 3D Mammographic images were obtained following ultrasound guided
biopsy of the left breast. The biopsy marking clip is in expected
position at the site of biopsy.
IMPRESSION: Appropriate positioning of the ribbon shaped biopsy marking clip at
the site of biopsy in the upper inner left breast.

Final Assessment: Post Procedure Mammograms for Marker Placement

## 2021-12-04 IMAGING — US US BREAST BX W LOC DEV 1ST LESION IMG BX SPEC US GUIDE*L*
1 series · 10 of 10 positions shown · non-contrast
Comparison: Previous exam(s).
COMPARISON: Previous exam(s).

Addendum:
CLINICAL DATA: 60-year-old female with an indeterminate left breast
mass.

EXAM:
ULTRASOUND GUIDED LEFT BREAST CORE NEEDLE BIOPSY

[Series 1: us breast bx w loc dev 1st lesion img bx spec us g · 0.06mm/px · 10 of 10 slices shown]
[im 1/10]
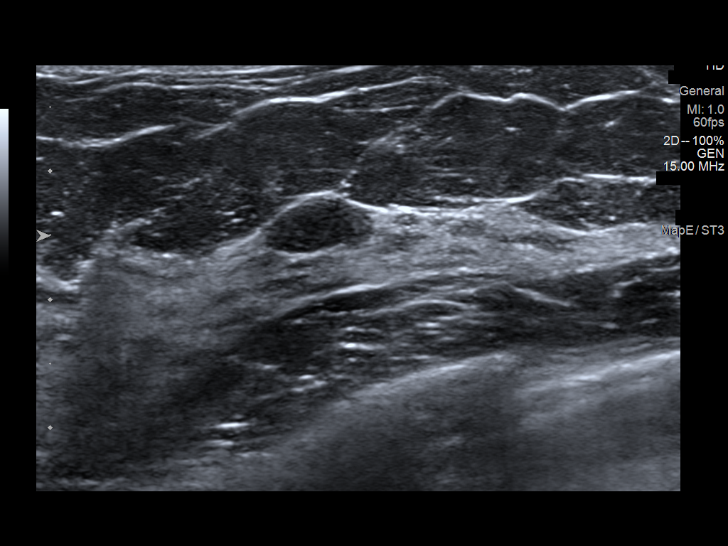
[im 2/10]
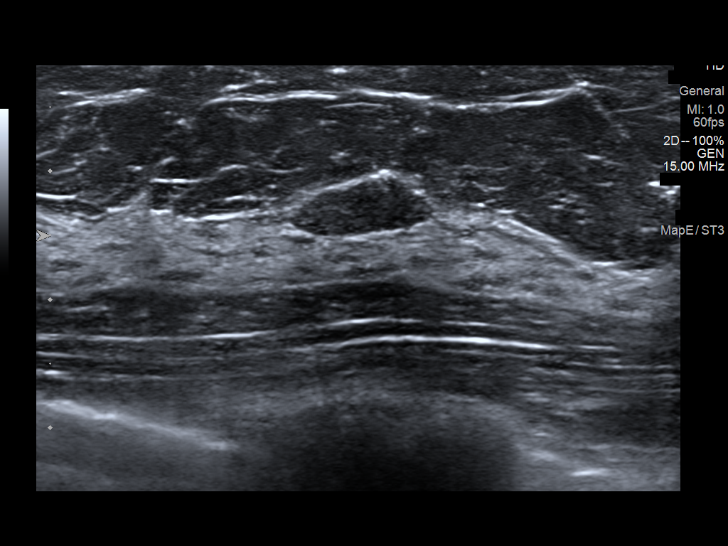
[im 3/10]
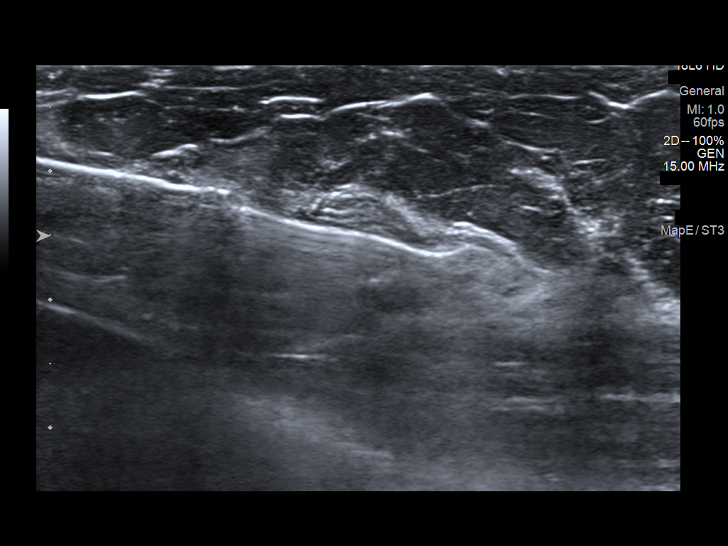
[im 4/10]
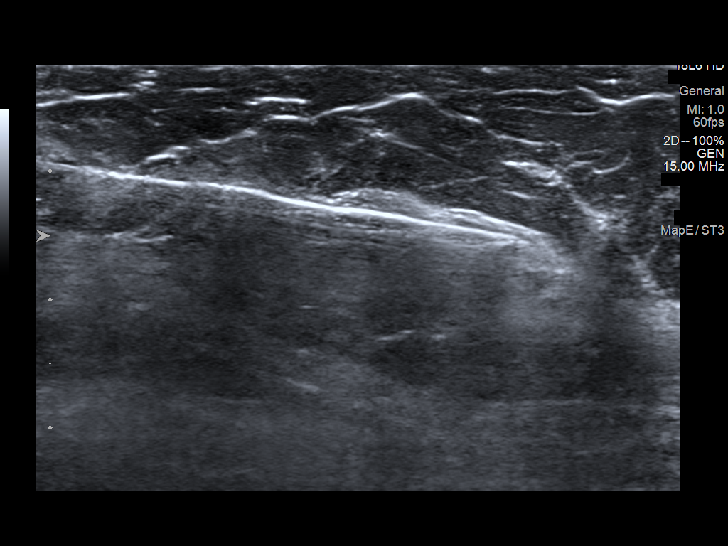
[im 5/10]
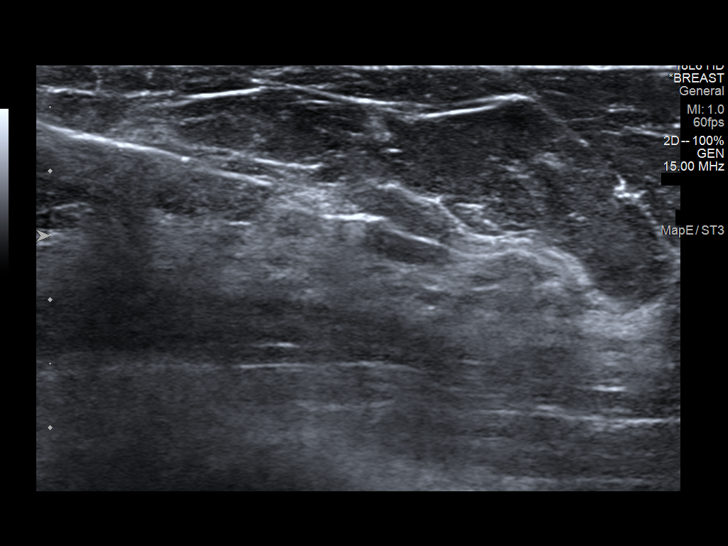
[im 6/10]
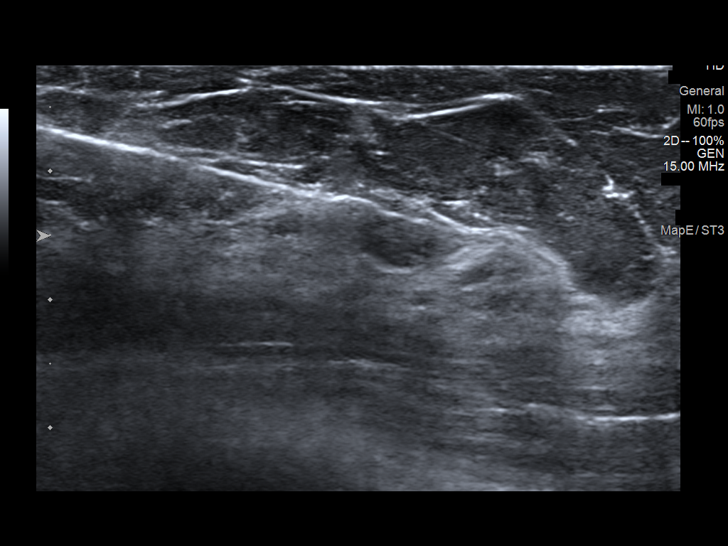
[im 7/10]
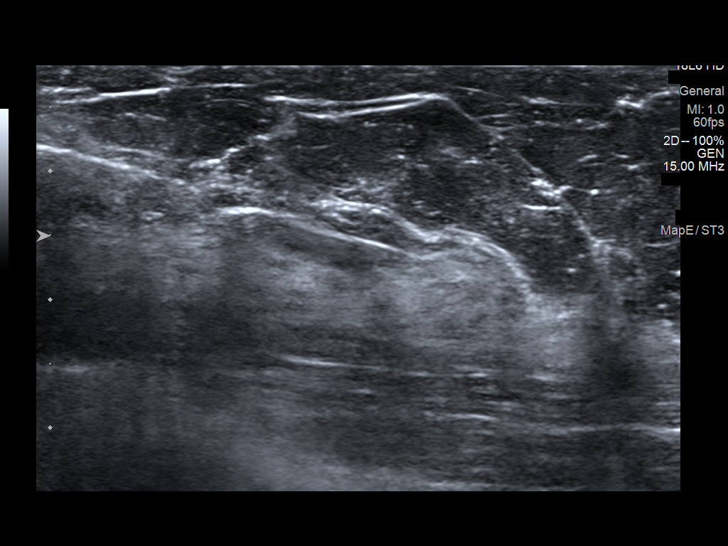
[im 8/10]
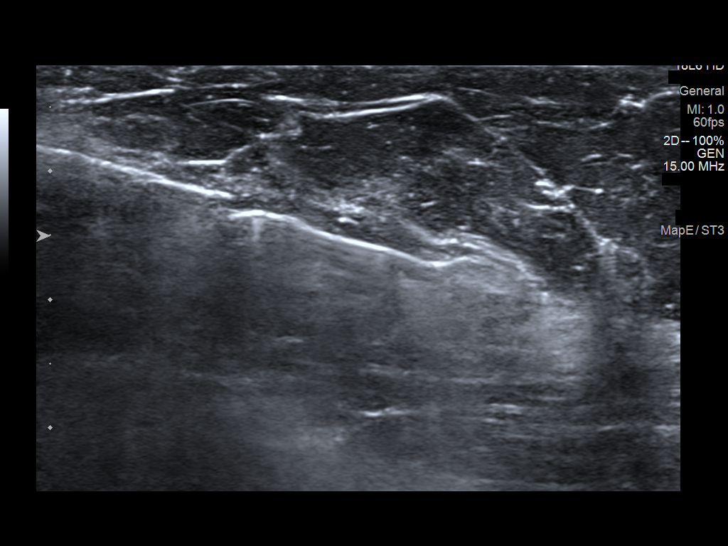
[im 9/10]
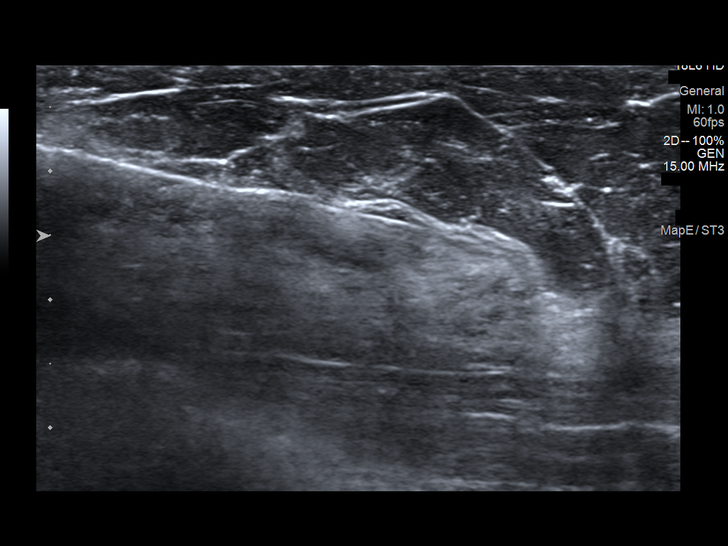
[im 10/10]
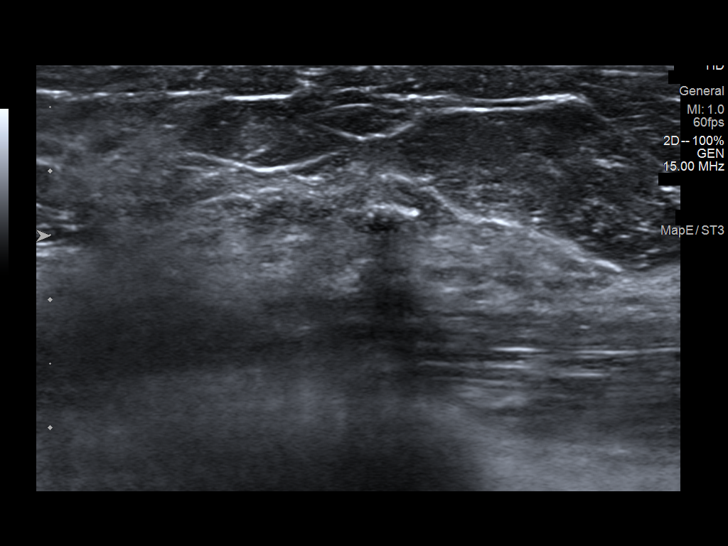

[10 of 10 positions shown; findings below may reference images not displayed]



Lesion quadrant: Upper inner quadrant

Using sterile technique and 1% Lidocaine as local anesthetic, under
direct ultrasound visualization, a 14 gauge Giorgi device was
used to perform biopsy of a mass at the 10 o'clock position of the
left breast using a inferior approach. At the conclusion of the
procedure a ribbon shaped tissue marker clip was deployed into the
biopsy cavity. Follow up 2 view mammogram was performed and dictated
separately.
IMPRESSION: Ultrasound guided biopsy of the left breast. No apparent
complications.

ADDENDUM:
Pathology revealed SCLEROTIC FIBROADENOMA - NO MALIGNANCY IDENTIFIED
of the LEFT breast, 10 o'clock, 3cmfn (ribbon clip). This was found
to be concordant by Dr. Luis Mario Cheng.

Pathology results were discussed with the patient by telephone. The
patient reported doing well after the biopsy with tenderness at the
site. Post biopsy instructions and care were reviewed and questions
were answered. The patient was encouraged to call The [REDACTED]

The patient was instructed to return for annual screening
mammography and informed a reminder notice would be sent regarding
this appointment.

Pathology results reported by Jette Odgaard Maksic RN on 07/17/2021.



Lesion quadrant: Upper inner quadrant

Using sterile technique and 1% Lidocaine as local anesthetic, under
direct ultrasound visualization, a 14 gauge Giorgi device was
used to perform biopsy of a mass at the 10 o'clock position of the
left breast using a inferior approach. At the conclusion of the
procedure a ribbon shaped tissue marker clip was deployed into the
biopsy cavity. Follow up 2 view mammogram was performed and dictated
separately.
IMPRESSION: Ultrasound guided biopsy of the left breast. No apparent
complications.

## 2022-02-05 ENCOUNTER — Ambulatory Visit: Payer: BC Managed Care – PPO | Admitting: Family Medicine

## 2022-02-05 ENCOUNTER — Encounter: Payer: Self-pay | Admitting: Family Medicine

## 2022-02-05 ENCOUNTER — Ambulatory Visit: Payer: Self-pay

## 2022-02-05 VITALS — BP 112/72 | Ht 64.0 in | Wt 129.0 lb

## 2022-02-05 DIAGNOSIS — M25571 Pain in right ankle and joints of right foot: Secondary | ICD-10-CM

## 2022-02-05 DIAGNOSIS — S8261XA Displaced fracture of lateral malleolus of right fibula, initial encounter for closed fracture: Secondary | ICD-10-CM

## 2022-02-05 DIAGNOSIS — S8263XA Displaced fracture of lateral malleolus of unspecified fibula, initial encounter for closed fracture: Secondary | ICD-10-CM | POA: Insufficient documentation

## 2022-02-05 NOTE — Progress Notes (Signed)
   Sylvia White is a 61 y.o. female who presents to [facility] today for the following:  Patient reports that she was running on May 28 when she inverted her right ankle.  She immediately noticed swelling afterwards.  She was unable to run on it but was able to walk after the injury.  She has sensed iced her ankle but has not noticed complete resolution of the swelling.  She scheduled an appointment last week due to her toes being discolored.  The following day, she used a ball to massage under her foot and felt like that helped with the discoloration and pain.  She is concerned that she may have a fracture or dislocated bone as the pain sometimes awakens her during the night.  She is interested to know what she can do for rehab and will brace would help the situation.  She has a history of recurrent ankle sprains and history of shinsplints to her left leg.  PMH reviewed.  ROS as above. Medications reviewed.  Exam:  BP 112/72   Ht 5\' 4"  (1.626 m)   Wt 129 lb (58.5 kg)   BMI 22.14 kg/m  Gen: Well NAD MSK: Right Ankle: Inspection: No visible erythema, mild swelling and resolving ecchymosis to heel and over first metatarsal. Palpation: No pain at base of 5th MT; No tenderness over cuboid; No tenderness over N spot or navicular prominence Mild tenderness on posterior aspect of lateral malleolus but not medial malleolus  Talar dome nontender; Range of motion is full in all directions. Strength is 5/5 in all directions. Stable lateral and medial ligaments; squeeze test and kleiger test unremarkable; No significant hypermobility in testing ankle ligaments Able to walk 4 steps.  Limited MSK u/s right ankle:  Small ankle effusion.  ATFL appears intact.  Lateral malleolus with small avulsion distal to level of ankle joint.  Talar dome appears normal.  Assessment and Plan: 1) Avulsion fracture of lateral malleolus Able to visualize avulsion fracture of lateral malleolus under ultrasound. All ligaments  intact and no other abnormalities noted. Discussed that treatment is similar to ankle sprain, though duration of rehab and healing process may be longer. Can do normal walking activities but would limit any weight bearing exercises (running, rowing, etc) for at least the next couple weeks. Should follow up in the next 3 weeks to assess progress. No bracing is necessary, though patient can choose to wear for comfort if she desires. All questions answered, patient amenable to plan.   Sharion Settler PGY-2 Family Medicine

## 2022-02-05 NOTE — Patient Instructions (Signed)
You have an avulsion fracture of your distal fibula. These are treated the same as a bad ankle sprain. Ice the area for 15 minutes at a time, 3-4 times a day Aleve 2 tabs twice a day with food OR ibuprofen 3 tabs three times a day with food for pain and inflammation as needed. Elevate above the level of your heart when possible Bear weight as tolerated Consider boot or brace when up and walking around if this feels more comfortable. Come out of the boot/brace twice a day to do Up/down and alphabet exercises 2-3 sets of each. Start theraband strengthening exercises in 2 weeks - once a day 3 sets of 10. Follow up in 3 weeks.  We will discuss return to running at that appointment depending on how you're doing clinically.

## 2022-02-05 NOTE — Assessment & Plan Note (Addendum)
Able to visualize avulsion fracture of lateral malleolus under ultrasound. All ligaments intact and no other abnormalities noted. Discussed that treatment is similar to ankle sprain, though duration of rehab and healing process may be longer. Can do normal walking activities but would limit any weight bearing exercises (running, rowing, etc) for at least the next couple weeks. Should follow up in the next 3 weeks to assess progress. No bracing is necessary, though patient can choose to wear for comfort if she desires. All questions answered, patient amenable to plan.

## 2022-02-28 ENCOUNTER — Ambulatory Visit: Payer: BC Managed Care – PPO | Admitting: Family Medicine

## 2022-02-28 VITALS — BP 110/60 | Ht 64.0 in | Wt 127.0 lb

## 2022-02-28 DIAGNOSIS — S8261XD Displaced fracture of lateral malleolus of right fibula, subsequent encounter for closed fracture with routine healing: Secondary | ICD-10-CM

## 2022-02-28 NOTE — Progress Notes (Unsigned)
   Established Patient Office Visit  Subjective   Patient ID: Sylvia White, female    DOB: 07-Dec-1960  Age: 61 y.o. MRN: [id number]  No chief complaint on file.   Sylvia White is here for follow-up of her avulsion fracture of her right lateral malleolus.  She is about 5 weeks out from her injury.  She says she is doing much better now.  Her gait is back to normal and the ankle feels stable with walking.  She has been doing stretches with an exercise band at home 1 time per day over the last week.  She says occasionally while doing her exercises she feels a twinge of discomfort in the right posterior lateral aspect of the ankle but otherwise it feels good.  She has been wearing the lace up ankle brace when walking outside the house.  She has not yet tried advancing her activity past walking, no jogging or running.    {History (Optional):23778}  ROS    Objective:     BP 110/60   Ht 5\' 4"  (1.626 m)   Wt 127 lb (57.6 kg)   BMI 21.80 kg/m  {Vitals History (Optional):23777}  Physical Exam   No results found for any visits on 02/28/22.  {Labs (Optional):23779}  The ASCVD Risk score (Arnett DK, et al., 2019) failed to calculate for the following reasons:   Cannot find a previous HDL lab   Cannot find a previous total cholesterol lab    Assessment & Plan:   Problem List Items Addressed This Visit   None   No follow-ups on file.    2020, MD

## 2022-07-30 ENCOUNTER — Other Ambulatory Visit: Payer: Self-pay | Admitting: Family Medicine

## 2022-07-30 DIAGNOSIS — Z1231 Encounter for screening mammogram for malignant neoplasm of breast: Secondary | ICD-10-CM

## 2022-09-28 ENCOUNTER — Ambulatory Visit
Admission: RE | Admit: 2022-09-28 | Discharge: 2022-09-28 | Disposition: A | Discharge status: Home / Self Care | Payer: BC Managed Care – PPO | Source: Ambulatory Visit | Attending: Family Medicine | Admitting: Family Medicine

## 2022-09-28 DIAGNOSIS — Z1231 Encounter for screening mammogram for malignant neoplasm of breast: Secondary | ICD-10-CM

## 2023-05-13 ENCOUNTER — Other Ambulatory Visit: Payer: Self-pay | Admitting: Pediatrics

## 2023-05-13 DIAGNOSIS — Z8739 Personal history of other diseases of the musculoskeletal system and connective tissue: Secondary | ICD-10-CM

## 2023-05-24 ENCOUNTER — Ambulatory Visit: Payer: Self-pay | Admitting: Cardiology

## 2023-08-12 ENCOUNTER — Ambulatory Visit (HOSPITAL_BASED_OUTPATIENT_CLINIC_OR_DEPARTMENT_OTHER): Payer: BC Managed Care – PPO | Admitting: Cardiology

## 2023-08-12 ENCOUNTER — Encounter (HOSPITAL_BASED_OUTPATIENT_CLINIC_OR_DEPARTMENT_OTHER): Payer: Self-pay | Admitting: Cardiology

## 2023-08-12 VITALS — BP 120/80 | HR 61 | Ht 64.0 in | Wt 133.4 lb

## 2023-08-12 DIAGNOSIS — R9431 Abnormal electrocardiogram [ECG] [EKG]: Secondary | ICD-10-CM | POA: Diagnosis not present

## 2023-08-12 DIAGNOSIS — Z8249 Family history of ischemic heart disease and other diseases of the circulatory system: Secondary | ICD-10-CM | POA: Diagnosis not present

## 2023-08-12 DIAGNOSIS — Z7189 Other specified counseling: Secondary | ICD-10-CM

## 2023-08-12 DIAGNOSIS — R002 Palpitations: Secondary | ICD-10-CM

## 2023-08-12 NOTE — Patient Instructions (Addendum)
Medication Instructions:  Your physician recommends that you continue on your current medications as directed. Please refer to the Current Medication list given to you today.   Labwork: NONE  Testing/Procedures: NONE  Follow-Up: 1 YEAR WITH DR CHRISTOPHER   Any Other Special Instructions Will Be Listed Below (If Applicable).  Look into Kardia mobile to try to catch events. You can send me the pdf of strips through mychart.  Vagal maneuvers are other ways to try to manage SVT. Here is some information for the [facility]: What are vagal maneuvers? Vagal maneuvers are physical actions that make your vagus nerve act on your heart's natural pacemaker, slowing down its electrical impulses. Your vagus nerve -- which goes from your brainstem to your belly -- plays a major role in your parasympathetic nervous system, which controls a number of things in your body, including heart rate.  Healthcare providers can do vagal maneuvers when it makes sense for a person with a fast heart rate. Don't try these yourself without talking to your healthcare provider first.  Types of vagal maneuvers Healthcare providers often use these:  Valsalva maneuver (bearing down like you're having a bowel movement (pooping). See below). Diving reflex. Carotid sinus massage. Gag reflex. Coughing. Handstand for 30 seconds. (In one study, healthcare providers taught parents how to help their kids do this.) Applied abdominal pressure. (Try lying on your back and folding your lower body toward your face until your feet are past your head. Take a breath and strain for 20 to 30 seconds.) Why are vagal maneuvers used? Vagal maneuvers are a first-line (first choice) treatment for supraventricular tachycardia (SVT) (fast heart rate) because they're a low-risk, low-cost way to slow down a heart rate that's too fast. They can have a 20% to 40% success rate for getting certain fast heart rhythms (more than 100 beats a  minute) back to normal rhythms.  Vagal maneuvers can also help your healthcare provider diagnose which type of arrhythmia (irregular or abnormal beat) you have, as certain types of heart rhythm disorders classically respond to this maneuver.  Who shouldn't have vagal maneuvers? Your healthcare provider will only use vagal maneuvers if you're considered stable. They won't do vagal maneuvers if you're unstable, meaning you have:  Low blood pressure. Chest pain. Shortness of breath. A shortage of oxygen in your body. An inability to get enough blood to your organs. If you're unstable, your healthcare provider will do cardioversion (using medicine or an electrical shock) instead of vagal maneuvers. Anyone who's feeling unwell should go to an emergency room or call 911 immediately.  How commonly are vagal maneuvers used? Supraventricular tachycardia (SVT) is common in adults and children, and is the most common heart rhythm abnormality in children. An estimated 1 in 250 to 1 in 1,000 children have SVT. Since vagal maneuvers are the first treatment choice for SVT, they're commonly used.  Procedure Details What happens before vagal maneuvers? Your healthcare provider will do an electrocardiogram (EKG) to check your heart rhythm. They'll monitor your heart rate, blood pressure and oxygen level.  What happens during vagal maneuvers? Here's how healthcare providers do the three most common vagal maneuvers:  Diving reflex While sitting, you'll take several deep breaths, hold your breath and then quickly put your whole face into a container of ice water. Keep your face submerged as long as you can.  The alternative approach is putting a bag of ice water or an ice-cold, wet towel against your face.  Valsalva maneuver While  lying on your back, take a deep breath and act like you're exhaling but with your nose and mouth closed for 10 to 30 seconds. It should feel like trying to breathe air out into a  blocked straw.  In a modified version of this maneuver (which can work better than the original method), you can do this while sitting up and then have your healthcare provider quickly drop the part of the bed supporting your upper body.  When they lower your bed, they bring your knees to your chest or put your legs in the air. Keep your legs in that position 30 to 45 seconds longer than holding your breath.  Another Valsalva technique healthcare providers use for kids is to have them blow on their thumb without letting any air out.  Carotid sinus massage You'll lie on your back with your head turned to one side. Your healthcare provider will use their fingers to push on your carotid sinus for five to 10 seconds. If it doesn't work, they can try again after a minute or try the other side of your neck.  What happens after vagal maneuvers? Hopefully, the arrhythmia (irregular or abnormal beat) resolves. Your healthcare provider will do another electrocardiogram (EKG) to see if the vagal maneuver was successful at bringing your heart rhythm back to normal. If they try vagal maneuvers two or three times and they don't work, they can give you medication to treat your arrhythmia. Medical or electrical cardioversion is another treatment option.  If vagal maneuvers don't work, your healthcare provider may contact a cardiologist (heart specialist) to evaluate you.

## 2023-08-12 NOTE — Progress Notes (Signed)
Cardiology Office Note:  .   Date:  08/12/2023  ID:  Sylvia White, DOB May 12, 1961, MRN [id number] PCP: Clayborn Heron, MD   HeartCare Providers Cardiologist:  Jodelle Red, MD {  History of Present Illness: .   Sylvia White is a 62 y.o. female with PMH hypothyroidism. She is seen as a new consult on 08/12/23 for palpitations.  Today: Note from 04/17/23 from Dr. Rubye Oaks reviewed. Initially referred to [facility] for palpitations, seen today as practices have merged. Noted a 2 hour episode of irregular heart rhythm. Noted that both parents have history of atrial fibrillation. ECG at the visit was sinus with LVH. There is mention of a monitor but this was unable to be completed.  Tachycardia/palpitations: -Initial onset: 5 episodes total, 7/15, 8/20, 9/11, 9/25, 11/24. None in October, but then recurred just before Thanksgiving. -Frequency/Duration: last about 30 minutes -Associated symptoms: none -Aggravating/alleviating factors: all occurred in the evening, not related to exercise. Not helped by vagal maneuvers. -Syncope/near syncope: none -Prior cardiac history: none. Heart murmur as a child, no surgery required -Prior workup: none -Prior treatment: none -Possible medication interactions: stable synthroid dose -Caffeine: drinks 1 cup of green tea 4 days/week in the morning -Alcohol: almost none -Tobacco: never -Diet: she is vegetarian -Exercise level: runs 3-4 miles three days/week, uses rowing machine 2 days/week -Cardiac ROS: no chest pain, no shortness of breath, no PND, no orthopnea, no LE edema. -Family history:  both parents have afib, diagnosed later in life (in their 49s). Father is 37, mother is 73. Younger sister has PVCs, is an avid cyclist.  ROS: Denies chest pain, shortness of breath at rest or with normal exertion. No PND, orthopnea, LE edema or unexpected weight gain. No syncope. ROS otherwise negative except as noted.   Studies Reviewed: Marland Kitchen     EKG:  EKG Interpretation Date/Time:  Monday August 12 2023 10:53:51 EST Ventricular Rate:  61 PR Interval:  124 QRS Duration:  88 QT Interval:  420 QTC Calculation: 422 R Axis:   86  Text Interpretation: Normal sinus rhythm Minimal voltage criteria for LVH, may be normal variant Confirmed by Jodelle Red [phone]) on 08/12/2023 11:12:28 AM    Physical Exam:   VS:  BP 120/80   Pulse 61   Ht 5\' 4"  (1.626 m)   Wt 133 lb 6.4 oz (60.5 kg)   SpO2 98%   BMI 22.90 kg/m    Wt Readings from Last 3 Encounters:  08/12/23 133 lb 6.4 oz (60.5 kg)  02/28/22 127 lb (57.6 kg)  02/05/22 129 lb (58.5 kg)    GEN: Well nourished, well developed in no acute distress HEENT: Normal, moist mucous membranes NECK: No JVD CARDIAC: regular rhythm, normal S1 and S2, no rubs or gallops. No murmur. VASCULAR: Radial and DP pulses 2+ bilaterally. No carotid bruits RESPIRATORY:  Clear to auscultation without rales, wheezing or rhonchi  ABDOMEN: Soft, non-tender, non-distended MUSCULOSKELETAL:  Ambulates independently SKIN: Warm and dry, no edema NEUROLOGIC:  Alert and oriented x 3. No focal neuro deficits noted. PSYCHIATRIC:  Normal affect    ASSESSMENT AND PLAN: .    Palpitations Family history of atrial fibrillation -infrequent, unlikely to be caught by monitor -discussed kardia mobile, she will look into this -reviewed types of arrhythmias, how we would further investigate (SVT, afib/flutter, VT, etc) -reviewed red flag warning signs that need immediate medical attention  Abnormal ECG -discussed that LVH may be normal variant based on her body habitus. No other  risk factors -would get echo if arrhythmia seen  CV risk counseling and prevention -recommend heart healthy/Mediterranean diet, with whole grains, fruits, vegetable, fish, lean meats, nuts, and olive oil. Limit salt. She is vegetarian -recommend moderate walking, 3-5 times/week for 30-50 minutes each session. Aim for at least  150 minutes.week. Goal should be pace of 3 miles/hours, or walking 1.5 miles in 30 minutes. She does an excellent job with this  Dispo: 1 year or sooner as needed  Signed, Jodelle Red, MD   Jodelle Red, MD, PhD, [facility]  [facility]  [place]  [facility] at [facility] at [facility] [address], [address] [place], [place] [address] [phone]

## 2023-08-23 ENCOUNTER — Ambulatory Visit (HOSPITAL_BASED_OUTPATIENT_CLINIC_OR_DEPARTMENT_OTHER): Payer: BC Managed Care – PPO | Admitting: Cardiology

## 2023-11-29 ENCOUNTER — Other Ambulatory Visit: Payer: Self-pay | Admitting: Pediatrics

## 2023-11-29 DIAGNOSIS — Z1231 Encounter for screening mammogram for malignant neoplasm of breast: Secondary | ICD-10-CM

## 2023-12-06 ENCOUNTER — Ambulatory Visit
Admission: RE | Admit: 2023-12-06 | Discharge: 2023-12-06 | Disposition: A | Discharge status: Home / Self Care | Payer: Self-pay | Source: Ambulatory Visit | Attending: Pediatrics | Admitting: Pediatrics

## 2023-12-06 DIAGNOSIS — Z1231 Encounter for screening mammogram for malignant neoplasm of breast: Secondary | ICD-10-CM

## 2023-12-11 ENCOUNTER — Other Ambulatory Visit: Payer: Self-pay | Admitting: Family Medicine

## 2023-12-11 DIAGNOSIS — R928 Other abnormal and inconclusive findings on diagnostic imaging of breast: Secondary | ICD-10-CM

## 2023-12-20 ENCOUNTER — Ambulatory Visit

## 2023-12-20 ENCOUNTER — Ambulatory Visit
Admission: RE | Admit: 2023-12-20 | Discharge: 2023-12-20 | Disposition: A | Discharge status: Home / Self Care | Payer: BC Managed Care – PPO | Source: Ambulatory Visit | Attending: Pediatrics | Admitting: Pediatrics

## 2023-12-20 ENCOUNTER — Ambulatory Visit
Admission: RE | Admit: 2023-12-20 | Discharge: 2023-12-20 | Disposition: A | Discharge status: Home / Self Care | Source: Ambulatory Visit | Attending: Family Medicine | Admitting: Family Medicine

## 2023-12-20 DIAGNOSIS — R928 Other abnormal and inconclusive findings on diagnostic imaging of breast: Secondary | ICD-10-CM

## 2023-12-20 DIAGNOSIS — Z8739 Personal history of other diseases of the musculoskeletal system and connective tissue: Secondary | ICD-10-CM

## 2024-01-05 ENCOUNTER — Encounter (HOSPITAL_BASED_OUTPATIENT_CLINIC_OR_DEPARTMENT_OTHER): Payer: Self-pay

## 2024-01-06 NOTE — Telephone Encounter (Signed)
 Please review and advise.

## 2024-01-08 NOTE — Telephone Encounter (Signed)
 Pt called in to sch f/u. Next available in 8/21. Pt states she cannot wait that long, would like to speak with nurse.

## 2024-03-02 NOTE — Progress Notes (Signed)
 Cardiology Office Note   Date:  03/09/2024  ID:  Sylvia White, DOB [birth year], MRN [id number] PCP: Claudene Lacks, MD  [facility] Providers Cardiologist:  Shelda Bruckner, MD     [facility] Palpitations Atrial fibrillation Hypothyroidism OSA  Patient established with Dr. Bruckner 08/12/2023 for palpitations.  She was seen by [facility] cardiology 04/17/2023.  She noted a 2-hour episode of irregular heart rhythm.  History of both parents who have atrial fibrillation.  ECG at that visit was sinus rhythm with LVH.  Tachycardia/palpitations: -Initial onset: 5 episodes total, 7/15, 8/20, 9/11, 9/25, 11/24. None in October, but then recurred just before Thanksgiving. Return of a fib/tachycardia 01/04/24 - rhythm strips attached -Frequency/Duration: last about 30 minutes -Associated symptoms: none -Aggravating/alleviating factors: all occurred in the evening, not related to exercise. Not helped by vagal maneuvers. May 2025 episode occurred after exercise -Syncope/near syncope: none -Prior cardiac history: none. Heart murmur as a child, no surgery required -Prior workup: none -Prior treatment: none -Possible medication interactions: stable synthroid dose -Caffeine: drinks 1 cup of green tea 4 days/week in the morning -Alcohol: almost none -Tobacco: never -Diet: she is vegetarian -Exercise level: runs 3-4 miles three days/week, uses rowing machine 2 days/week -Cardiac ROS: no chest pain, no shortness of breath, no PND, no orthopnea, no LE edema. -Family history:  both parents have afib, diagnosed later in life (in their 52s). Father is 46, mother is 3. Younger sister has PVCs, is an avid cyclist.  History of Present Illness Discussed the use of AI scribe software for clinical note transcription with the patient, who gave verbal consent to proceed.  History of Present Illness Sylvia White is a very pleasant 63 year old female who is here today for follow-up of irregular heart rate.  In May, she had episodes of irregular heartbeat captured on her Smart watch. One episode occurred during running, with a persistent elevated heart rate post-exercise. She recorded this episode using [employer] and sent it to the cardiology team. Another prolonged episode occurred on May 29th at night, recorded by her smartwatch, but not sent. She has not had further episodes since and monitors her heart rhythm weekly. She was advised by Dr. Bruckner that strips revealed atrial fibrillation.  She was recently diagnosed with severe obstructive sleep apnea, with AHI score of 44, and is obtaining a CPAP machine. Sleep apnea felt to be caused by obstruction due to an overbite and a small lower jaw. Her mother has a history of AFib. No recent changes in caffeine or hydration levels. She does not experience chest pain, nausea, or syncope during episodes of A-fib.  HR was > 150 bpm with these occurrences and improved without intervention. She is very active with regular exercise and a generally active lifestyle including healthy diet.  She denies shortness of breath, orthopnea, PND, edema, presyncope, syncope.  ROS: See HPI  Studies Reviewed EKG Interpretation Date/Time:  Monday March 09 2024 08:38:09 EDT Ventricular Rate:  72 PR Interval:  118 QRS Duration:  82 QT Interval:  392 QTC Calculation: 429 R Axis:   88  Text Interpretation: Normal sinus rhythm Normal ECG When compared with ECG of 12-Aug-2023 10:53, No significant change was found Confirmed by Percy Browning [phone]) on 03/09/2024 8:46:33 AM     No results found for: LIPOA  Risk Assessment/Calculations  CHA2DS2-VASc Score = 1   This indicates a 0.6% annual risk of stroke. The patient's score is based upon: CHF History: 0 HTN History: 0 Diabetes History: 0 Stroke  History: 0 Vascular Disease History: 0 Age Score: 0 Gender Score: 1             Physical Exam VS:  BP 118/80   Pulse 72   Ht 5' 4 (1.626 m)   Wt 124 lb (56.2  kg)   SpO2 98%   BMI 21.28 kg/m    Wt Readings from Last 3 Encounters:  03/09/24 124 lb (56.2 kg)  08/12/23 133 lb 6.4 oz (60.5 kg)  02/28/22 127 lb (57.6 kg)    GEN: Well nourished, well developed in no acute distress NECK: No JVD; No carotid bruits CARDIAC: RRR, no murmurs, rubs, gallops RESPIRATORY:  Clear to auscultation without rales, wheezing or rhonchi  ABDOMEN: Soft, non-tender, non-distended EXTREMITIES:  No edema; No deformity   Assessment and Plan Assessment & Plan Paroxsymal atrial fibrillation  Intermittent irregular heartbeat episodes suggest atrial fibrillation or atrial tachycardia, felt most likely to be atrial fibrillation per review by primary cardiologist of rhythm strips sent 12/2023. Symptoms include tachy palpitations that resolve without intervention. CHA2DS2-VASc score of 1 does not require anticoagulation, which we discussed. We will get echocardiogram to assess heart structure and valve function. EKG today reveals NSR. Consider a 30-day monitor or LINQ monitor for continuous rhythm assessment. Discuss potential ablation with an electrophysiologist if atrial fibrillation is confirmed and persists. Consider AV nodal blocking agent for episodes of sustained tachycardia.  Will review treatment options with patient and plan to follow-up according to her preference for monitoring, or alleviating further episodes of A-fib with treatment of OSA  Severe obstructive sleep apnea   Recently diagnosed with severe obstructive sleep apnea, likely due to anatomical factors. She is pursuing CPAP therapy. Encouraged provided. Management per [facility].   Hypothyroidism Stable thyroid  function on labs completed 01/09/2024.  No indication that abnormal thyroid  function is contributing to tachypalpitations.  Management per endocrinology.  CV Risk Assessment Family history heart disease Family history of coronary artery disease and a fib.  Would like to be screened as active  as possible to prevent ASCVD risk.  We will get CT calcium score for risk assessment.  Lipid panel completed 01/27/2024 with total cholesterol 184, HDL 75, triglycerides 81, and LDL-C 94.  Hemoglobin A1c is 5.3%        Dispo: TBD based on preferences for monitoring  Signed, Rosaline Bane, NP-C

## 2024-03-09 ENCOUNTER — Encounter (HOSPITAL_BASED_OUTPATIENT_CLINIC_OR_DEPARTMENT_OTHER): Payer: Self-pay | Admitting: Nurse Practitioner

## 2024-03-09 ENCOUNTER — Encounter (HOSPITAL_BASED_OUTPATIENT_CLINIC_OR_DEPARTMENT_OTHER): Payer: Self-pay

## 2024-03-09 ENCOUNTER — Ambulatory Visit (HOSPITAL_BASED_OUTPATIENT_CLINIC_OR_DEPARTMENT_OTHER): Admitting: Nurse Practitioner

## 2024-03-09 VITALS — BP 118/80 | HR 72 | Ht 64.0 in | Wt 124.0 lb

## 2024-03-09 DIAGNOSIS — E063 Autoimmune thyroiditis: Secondary | ICD-10-CM | POA: Diagnosis not present

## 2024-03-09 DIAGNOSIS — Z7189 Other specified counseling: Secondary | ICD-10-CM

## 2024-03-09 DIAGNOSIS — R002 Palpitations: Secondary | ICD-10-CM

## 2024-03-09 DIAGNOSIS — I480 Paroxysmal atrial fibrillation: Secondary | ICD-10-CM | POA: Diagnosis not present

## 2024-03-09 DIAGNOSIS — Z8249 Family history of ischemic heart disease and other diseases of the circulatory system: Secondary | ICD-10-CM

## 2024-03-09 NOTE — Patient Instructions (Signed)
 Medication Instructions:   Your physician recommends that you continue on your current medications as directed. Please refer to the Current Medication list given to you today.   *If you need a refill on your cardiac medications before your next appointment, please call your pharmacy*  Lab Work:  None ordered.  If you have labs (blood work) drawn today and your tests are completely normal, you will receive your results only by: MyChart Message (if you have MyChart) OR A paper copy in the mail If you have any lab test that is abnormal or we need to change your treatment, we will call you to review the results.  Testing/Procedures:  Your physician has requested that you have an echocardiogram. Echocardiography is a painless test that uses sound waves to create images of your heart. It provides your doctor with information about the size and shape of your heart and how well your heart's chambers and valves are working. This procedure takes approximately one hour. There are no restrictions for this procedure. Please do NOT wear cologne, perfume or lotions (deodorant is allowed). Please arrive 15 minutes prior to your appointment time.  Rosaline Bane, NP has ordered a CT coronary calcium score.   Test locations:   [facility], [place]    This is $99 out of pocket.   Coronary CalciumScan A coronary calcium scan is an imaging test used to look for deposits of calcium and other fatty materials (plaques) in the inner lining of the blood vessels of the heart (coronary arteries). These deposits of calcium and plaques can partly clog and narrow the coronary arteries without producing any symptoms or warning signs. This puts a person at risk for a heart attack. This test can detect these deposits before symptoms develop. Tell a health care provider about: Any allergies you have. All medicines you are taking, including vitamins, herbs, eye drops, creams, and over-the-counter  medicines. Any problems you or family members have had with anesthetic medicines. Any blood disorders you have. Any surgeries you have had. Any medical conditions you have. Whether you are pregnant or may be pregnant. What are the risks? Generally, this is a safe procedure. However, problems may occur, including: Harm to a pregnant woman and her unborn baby. This test involves the use of radiation. Radiation exposure can be dangerous to a pregnant woman and her unborn baby. If you are pregnant, you generally should not have this procedure done. Slight increase in the risk of cancer. This is because of the radiation involved in the test. What happens before the procedure? No preparation is needed for this procedure. What happens during the procedure? You will undress and remove any jewelry around your neck or chest. You will put on a hospital gown. Sticky electrodes will be placed on your chest. The electrodes will be connected to an electrocardiogram (ECG) machine to record a tracing of the electrical activity of your heart. A CT scanner will take pictures of your heart. During this time, you will be asked to lie still and hold your breath for 2-3 seconds while a picture of your heart is being taken. The procedure may vary among health care providers and hospitals. What happens after the procedure? You can get dressed. You can return to your normal activities. It is up to you to get the results of your test. Ask your health care provider, or the department that is doing the test, when your results will be ready. Summary A coronary calcium scan is an imaging test used  to look for deposits of calcium and other fatty materials (plaques) in the inner lining of the blood vessels of the heart (coronary arteries). Generally, this is a safe procedure. Tell your health care provider if you are pregnant or may be pregnant. No preparation is needed for this procedure. A CT scanner will take pictures  of your heart. You can return to your normal activities after the scan is done. This information is not intended to replace advice given to you by your health care provider. Make sure you discuss any questions you have with your health care provider. Document Released: 02/09/2008 Document Revised: 07/02/2016 Document Reviewed: 07/02/2016 Elsevier Interactive Patient Education  2017 [employer].   Follow-Up: At [facility], you and your health needs are our priority.  As part of our continuing mission to provide you with exceptional heart care, our providers are all part of one team.  This team includes your primary Cardiologist (physician) and Advanced Practice Providers or APPs (Physician Assistants and Nurse Practitioners) who all work together to provide you with the care you need, when you need it.  Your next appointment:   To be determined upon upcoming test results.

## 2024-03-23 ENCOUNTER — Ambulatory Visit (HOSPITAL_BASED_OUTPATIENT_CLINIC_OR_DEPARTMENT_OTHER)
Admission: RE | Admit: 2024-03-23 | Discharge: 2024-03-23 | Disposition: A | Discharge status: Home / Self Care | Payer: Self-pay | Source: Ambulatory Visit | Attending: Nurse Practitioner | Admitting: Nurse Practitioner

## 2024-03-23 DIAGNOSIS — I480 Paroxysmal atrial fibrillation: Secondary | ICD-10-CM | POA: Insufficient documentation

## 2024-03-23 DIAGNOSIS — R002 Palpitations: Secondary | ICD-10-CM | POA: Insufficient documentation

## 2024-03-24 ENCOUNTER — Ambulatory Visit: Payer: Self-pay | Admitting: Nurse Practitioner

## 2024-03-24 DIAGNOSIS — I2510 Atherosclerotic heart disease of native coronary artery without angina pectoris: Secondary | ICD-10-CM

## 2024-03-24 DIAGNOSIS — E785 Hyperlipidemia, unspecified: Secondary | ICD-10-CM

## 2024-03-30 MED ORDER — ROSUVASTATIN CALCIUM 10 MG PO TABS: 10.0000 mg | ORAL_TABLET | Freq: Every day | ORAL | 3 refills | Status: AC

## 2024-03-30 NOTE — Telephone Encounter (Signed)
 I would recommend that you start rosuvastatin  10 mg daily along with Coenzyme Q10 to help mitigate side effects. We will plan to recheck a detailed lipid panel along with lipoprotein a level and ALT (one of the liver enzymes) in 3 months to monitor improvement. Please let me know if you have any trouble tolerating rosuvastatin .  Take care!

## 2024-04-20 ENCOUNTER — Ambulatory Visit (HOSPITAL_BASED_OUTPATIENT_CLINIC_OR_DEPARTMENT_OTHER)

## 2024-04-20 DIAGNOSIS — R002 Palpitations: Secondary | ICD-10-CM | POA: Diagnosis not present

## 2024-04-20 DIAGNOSIS — I3139 Other pericardial effusion (noninflammatory): Secondary | ICD-10-CM

## 2024-04-20 DIAGNOSIS — I480 Paroxysmal atrial fibrillation: Secondary | ICD-10-CM

## 2024-04-20 DIAGNOSIS — I361 Nonrheumatic tricuspid (valve) insufficiency: Secondary | ICD-10-CM

## 2024-04-20 DIAGNOSIS — I517 Cardiomegaly: Secondary | ICD-10-CM | POA: Diagnosis not present

## 2024-04-21 LAB — ECHOCARDIOGRAM COMPLETE
AR max vel: 1.54 cm2
AV Area VTI: 1.4 cm2
AV Area mean vel: 1.57 cm2
AV Mean grad: 5 mmHg
AV Peak grad: 11.4 mmHg
Ao pk vel: 1.69 m/s
Area-P 1/2: 4.15 cm2
S' Lateral: 2.96 cm

## 2024-06-18 LAB — COLOGUARD: COLOGUARD: NEGATIVE
